# Patient Record
Sex: Male | Born: 1950 | Race: White | Hispanic: No | Marital: Married | Smoking: Never smoker
Health system: Southern US, Community
[De-identification: ages and names within clinical notes are randomized; demographics above are authoritative.]

## PROBLEM LIST (undated history)

## (undated) DIAGNOSIS — I2510 Atherosclerotic heart disease of native coronary artery without angina pectoris: Secondary | ICD-10-CM

## (undated) DIAGNOSIS — C801 Malignant (primary) neoplasm, unspecified: Secondary | ICD-10-CM

## (undated) DIAGNOSIS — E785 Hyperlipidemia, unspecified: Secondary | ICD-10-CM

## (undated) DIAGNOSIS — M1990 Unspecified osteoarthritis, unspecified site: Secondary | ICD-10-CM

## (undated) DIAGNOSIS — I10 Essential (primary) hypertension: Secondary | ICD-10-CM

## (undated) DIAGNOSIS — K219 Gastro-esophageal reflux disease without esophagitis: Secondary | ICD-10-CM

## (undated) HISTORY — DX: Unspecified osteoarthritis, unspecified site: M19.90

## (undated) HISTORY — DX: Gastro-esophageal reflux disease without esophagitis: K21.9

## (undated) HISTORY — DX: Hyperlipidemia, unspecified: E78.5

## (undated) HISTORY — DX: Atherosclerotic heart disease of native coronary artery without angina pectoris: I25.10

## (undated) HISTORY — PX: NOSE SURGERY: SHX723

## (undated) HISTORY — PX: JOINT REPLACEMENT: SHX530

## (undated) HISTORY — PX: KNEE ARTHROCENTESIS: SUR44

## (undated) HISTORY — DX: Essential (primary) hypertension: I10

## (undated) HISTORY — PX: SHOULDER ARTHROSCOPY: SHX128

## (undated) HISTORY — PX: CARDIAC CATHETERIZATION: SHX172

---

## 2002-11-11 ENCOUNTER — Encounter: Payer: Self-pay | Admitting: Cardiology

## 2002-11-11 ENCOUNTER — Inpatient Hospital Stay (HOSPITAL_COMMUNITY): Admission: AD | Admit: 2002-11-11 | Discharge: 2002-11-13 | Payer: Self-pay | Admitting: Cardiology

## 2002-11-24 ENCOUNTER — Encounter (HOSPITAL_COMMUNITY): Admission: RE | Admit: 2002-11-24 | Discharge: 2003-02-22 | Payer: Self-pay | Admitting: Cardiology

## 2003-02-23 ENCOUNTER — Encounter (HOSPITAL_COMMUNITY): Admission: RE | Admit: 2003-02-23 | Discharge: 2003-05-24 | Payer: Self-pay | Admitting: Cardiology

## 2010-10-09 ENCOUNTER — Encounter
Admission: RE | Admit: 2010-10-09 | Discharge: 2010-10-09 | Payer: Self-pay | Source: Home / Self Care | Attending: Specialist | Admitting: Specialist

## 2012-01-31 ENCOUNTER — Other Ambulatory Visit: Payer: Self-pay | Admitting: Orthopaedic Surgery

## 2012-01-31 DIAGNOSIS — M25512 Pain in left shoulder: Secondary | ICD-10-CM

## 2012-02-08 ENCOUNTER — Ambulatory Visit
Admission: RE | Admit: 2012-02-08 | Discharge: 2012-02-08 | Disposition: A | Discharge status: Home / Self Care | Payer: 59 | Source: Ambulatory Visit | Attending: Orthopaedic Surgery | Admitting: Orthopaedic Surgery

## 2012-02-08 DIAGNOSIS — M25512 Pain in left shoulder: Secondary | ICD-10-CM

## 2013-06-30 ENCOUNTER — Other Ambulatory Visit: Payer: Self-pay | Admitting: *Deleted

## 2013-06-30 DIAGNOSIS — Z79899 Other long term (current) drug therapy: Secondary | ICD-10-CM

## 2013-06-30 DIAGNOSIS — I2510 Atherosclerotic heart disease of native coronary artery without angina pectoris: Secondary | ICD-10-CM

## 2013-07-25 ENCOUNTER — Other Ambulatory Visit (INDEPENDENT_AMBULATORY_CARE_PROVIDER_SITE_OTHER): Payer: 59

## 2013-07-25 ENCOUNTER — Other Ambulatory Visit: Payer: Self-pay | Admitting: Cardiology

## 2013-07-25 DIAGNOSIS — Z79899 Other long term (current) drug therapy: Secondary | ICD-10-CM

## 2013-07-25 DIAGNOSIS — I2510 Atherosclerotic heart disease of native coronary artery without angina pectoris: Secondary | ICD-10-CM

## 2013-07-25 LAB — ALT: ALT: 35 U/L (ref 0–53)

## 2013-07-25 NOTE — Progress Notes (Signed)
Waiting on NMR panel

## 2013-07-28 LAB — NMR LIPOPROFILE WITH LIPIDS
Cholesterol, Total: 124 mg/dL (ref <200)
HDL Size: 8.4 nm — ABNORMAL LOW (ref >=9.2)
HDL-C: 41 mg/dL (ref >=40)
LDL (calc): 52 mg/dL (ref <100)
LDL Particle Number: 1055 nmol/L — ABNORMAL HIGH (ref <1000)
LDL Size: 20.1 nm — ABNORMAL LOW (ref >20.5)
Large HDL-P: 1.4 umol/L — ABNORMAL LOW (ref >=4.8)
Large VLDL-P: 9.3 nmol/L — ABNORMAL HIGH (ref <=2.7)
Triglycerides: 157 mg/dL — ABNORMAL HIGH (ref <150)
VLDL Size: 56.5 nm — ABNORMAL HIGH (ref <=46.6)

## 2013-07-31 ENCOUNTER — Other Ambulatory Visit: Payer: Self-pay | Admitting: General Surgery

## 2013-07-31 ENCOUNTER — Encounter: Payer: Self-pay | Admitting: General Surgery

## 2013-07-31 DIAGNOSIS — Z79899 Other long term (current) drug therapy: Secondary | ICD-10-CM

## 2013-07-31 DIAGNOSIS — I2510 Atherosclerotic heart disease of native coronary artery without angina pectoris: Secondary | ICD-10-CM

## 2013-11-13 ENCOUNTER — Other Ambulatory Visit: Payer: Self-pay | Admitting: Cardiology

## 2014-01-28 ENCOUNTER — Other Ambulatory Visit: Payer: 59

## 2014-02-23 ENCOUNTER — Other Ambulatory Visit: Payer: Self-pay | Admitting: Cardiology

## 2014-04-27 ENCOUNTER — Other Ambulatory Visit: Payer: Self-pay | Admitting: Cardiology

## 2014-04-27 NOTE — Telephone Encounter (Signed)
LVM for pt to return call. He has to set up F/U appt with TT before any more refills pt was due in July 2015.

## 2014-04-29 ENCOUNTER — Telehealth: Payer: Self-pay | Admitting: *Deleted

## 2014-04-29 NOTE — Telephone Encounter (Signed)
Ok to refill vytorin until patients appointment? Please advise. Thanks, MI

## 2014-04-29 NOTE — Telephone Encounter (Signed)
Follow up:    Pt called for refill and made a appt.

## 2014-04-29 NOTE — Telephone Encounter (Signed)
Already sent in for pt.

## 2014-06-26 ENCOUNTER — Other Ambulatory Visit: Payer: Self-pay | Admitting: Cardiology

## 2014-06-26 NOTE — Telephone Encounter (Signed)
I have seen this patient before

## 2014-07-13 ENCOUNTER — Ambulatory Visit: Payer: 59 | Admitting: Cardiology

## 2014-07-25 ENCOUNTER — Other Ambulatory Visit: Payer: Self-pay | Admitting: Cardiology

## 2014-07-27 NOTE — Telephone Encounter (Signed)
Please advise on refill. Patient has no-showed and cancelled the appointments that he had scheduled. He still has not been seen in this office. Thanks, MI

## 2014-08-03 ENCOUNTER — Other Ambulatory Visit: Payer: Self-pay

## 2014-08-03 ENCOUNTER — Ambulatory Visit (INDEPENDENT_AMBULATORY_CARE_PROVIDER_SITE_OTHER): Payer: 59 | Admitting: Cardiology

## 2014-08-03 ENCOUNTER — Other Ambulatory Visit: Payer: Self-pay | Admitting: Cardiology

## 2014-08-03 ENCOUNTER — Encounter: Payer: Self-pay | Admitting: Cardiology

## 2014-08-03 VITALS — BP 130/80 | HR 56 | Ht 69.0 in | Wt 207.0 lb

## 2014-08-03 DIAGNOSIS — E785 Hyperlipidemia, unspecified: Secondary | ICD-10-CM

## 2014-08-03 DIAGNOSIS — I2510 Atherosclerotic heart disease of native coronary artery without angina pectoris: Secondary | ICD-10-CM

## 2014-08-03 DIAGNOSIS — I2583 Coronary atherosclerosis due to lipid rich plaque: Principal | ICD-10-CM

## 2014-08-03 MED ORDER — NITROGLYCERIN 0.4 MG SL SUBL: 0.4000 mg | SUBLINGUAL_TABLET | SUBLINGUAL | Status: DC | PRN

## 2014-08-03 MED ORDER — ASPIRIN EC 81 MG PO TBEC: 81.0000 mg | DELAYED_RELEASE_TABLET | Freq: Every day | ORAL | Status: DC

## 2014-08-03 MED ORDER — EZETIMIBE-SIMVASTATIN 10-40 MG PO TABS: 1.0000 | ORAL_TABLET | Freq: Every day | ORAL | Status: DC

## 2014-08-03 MED ORDER — RAMIPRIL 5 MG PO CAPS: ORAL_CAPSULE | ORAL | Status: DC

## 2014-08-03 NOTE — Patient Instructions (Signed)
Your physician recommends that you have FASTING lab work.   Your physician has recommended you make the following change in your medication:  1) DECREASE your Aspirin to 81 mg daily   Your physician wants you to follow-up in: 1 year with Dr. Radford Pax. You will receive a reminder letter in the mail two months in advance. If you don't receive a letter, please call our office to schedule the follow-up appointment.

## 2014-08-03 NOTE — Progress Notes (Signed)
  [place], [place], [place]  [address] Phone: [phone] Fax:  [phone]  Date:  08/03/2014   ID:  Marcus Greer, DOB [birth year], MRN [id number]  PCP:  No primary care provider on file.  Cardiologist:  Fransico Him, MD    History of Present Illness: This is a 63yo WM with a history of ASCAD S/P PCI of the prox RCA with residual 40-50% distal RCA, dyslipidemia and GERD.  He is doing well today.  He denies any chest pain, SOB, DOE, LE edema, dizziness, palpitations or syncope.  He goes to the gym 3 days weekly and is walking 10,000 steps daily.   Wt Readings from Last 3 Encounters:  08/03/14 207 lb (93.895 kg)     Past Medical History  Diagnosis Date  . ASCVD (arteriosclerotic cardiovascular disease)   . GERD (gastroesophageal reflux disease)   . DJD (degenerative joint disease)   . Hyperlipidemia   . Coronary artery disease     s/p PCI of the prox RCA with residual 40-50% distal RCA 11/2002    Current Outpatient Prescriptions  Medication Sig Dispense Refill  . aspirin 325 MG tablet Take 325 mg by mouth daily.    Marland Kitchen ezetimibe-simvastatin (VYTORIN) 10-40 MG per tablet Take 1 tablet by mouth daily at 6 PM. 30 tablet 3  . Misc Natural Products (OSTEO BI-FLEX ADV DOUBLE ST) CAPS Take by mouth 2 (two) times daily.    . nitroGLYCERIN (NITROSTAT) 0.4 MG SL tablet Place 0.4 mg under the tongue every 5 (five) minutes as needed for chest pain.    . Omega-3 Fatty Acids (FISH OIL) 1200 MG CAPS Take by mouth 2 (two) times daily.    . ramipril (ALTACE) 5 MG capsule TAKE 1 CAPSULE BY MOUTH EVERY DAY 30 capsule 3  . vitamin C (ASCORBIC ACID) 500 MG tablet Take 500 mg by mouth daily.     No current facility-administered medications for this visit.    Allergies:    Allergies  Allergen Reactions  . Gemfibrozil   . Niaspan [Niacin Er]   . Tape     Social History:  The patient  reports that he has never smoked. He does not have any smokeless tobacco history on file. He  reports that he drinks alcohol.   Family History:  The patient's family history includes Diabetes in his father; Epilepsy in his sister.   ROS:  Please see the history of present illness.      All other systems reviewed and negative.   PHYSICAL EXAM: VS:  BP 130/80 mmHg  Pulse 56  Ht 5\' 9"  (1.753 m)  Wt 207 lb (93.895 kg)  BMI 30.55 kg/m2 Well nourished, well developed, in no acute distress HEENT: normal Neck: no JVD Cardiac:  normal S1, S2; RRR; no murmur Lungs:  clear to auscultation bilaterally, no wheezing, rhonchi or rales Abd: soft, nontender, no hepatomegaly Ext: no edema Skin: warm and dry Neuro:  CNs 2-12 intact, no focal abnormalities noted  EKG:  Sinus bradycardia at 56bpm with no ST changes     ASSESSMENT AND PLAN:  1. ASCAD s/p remote PCI of prox RCA with residual nonobstructive disease distally with no angina - decrease ASA to 81mg  daily 2. Dyslipidemia - continue Vytorin - check FLP and ALT  Followup with me in 1 year  Signed, Fransico Him, MD [facility] 08/03/2014 4:08 PM

## 2014-08-04 NOTE — Telephone Encounter (Addendum)
This medication was refilled on earlier today.

## 2014-08-05 ENCOUNTER — Other Ambulatory Visit: Payer: 59

## 2014-08-10 ENCOUNTER — Encounter: Payer: Self-pay | Admitting: Cardiology

## 2014-08-12 ENCOUNTER — Other Ambulatory Visit (INDEPENDENT_AMBULATORY_CARE_PROVIDER_SITE_OTHER): Payer: 59 | Admitting: *Deleted

## 2014-08-12 DIAGNOSIS — E785 Hyperlipidemia, unspecified: Secondary | ICD-10-CM

## 2014-08-12 DIAGNOSIS — I2510 Atherosclerotic heart disease of native coronary artery without angina pectoris: Secondary | ICD-10-CM

## 2014-08-12 DIAGNOSIS — I2583 Coronary atherosclerosis due to lipid rich plaque: Principal | ICD-10-CM

## 2014-08-12 LAB — LIPID PANEL
Cholesterol: 114 mg/dL (ref 0–200)
HDL: 43 mg/dL (ref >39)
LDL CALC: 53 mg/dL (ref 0–99)
Total CHOL/HDL Ratio: 2.7 Ratio
Triglycerides: 91 mg/dL (ref <150)
VLDL: 18 mg/dL (ref 0–40)

## 2014-08-12 LAB — ALT: ALT: 28 U/L (ref 0–53)

## 2014-08-29 ENCOUNTER — Other Ambulatory Visit: Payer: Self-pay | Admitting: Cardiology

## 2014-12-28 ENCOUNTER — Other Ambulatory Visit: Payer: Self-pay | Admitting: Cardiology

## 2015-01-08 ENCOUNTER — Ambulatory Visit (INDEPENDENT_AMBULATORY_CARE_PROVIDER_SITE_OTHER): Payer: 59

## 2015-01-08 ENCOUNTER — Ambulatory Visit (INDEPENDENT_AMBULATORY_CARE_PROVIDER_SITE_OTHER): Payer: 59 | Admitting: Podiatry

## 2015-01-08 ENCOUNTER — Encounter: Payer: Self-pay | Admitting: Podiatry

## 2015-01-08 VITALS — BP 153/83 | HR 57 | Resp 15

## 2015-01-08 DIAGNOSIS — M722 Plantar fascial fibromatosis: Secondary | ICD-10-CM

## 2015-01-08 MED ORDER — TRIAMCINOLONE ACETONIDE 10 MG/ML IJ SUSP
10.0000 mg | Freq: Once | INTRAMUSCULAR | Status: AC
  Administered 2015-01-08: 10 mg

## 2015-01-08 MED ORDER — DICLOFENAC SODIUM 75 MG PO TBEC: 75.0000 mg | DELAYED_RELEASE_TABLET | Freq: Two times a day (BID) | ORAL | Status: DC

## 2015-01-08 NOTE — Progress Notes (Signed)
Subjective:     Patient ID: Marcus Greer, male   DOB: November 24, 1950, 64 y.o.   MRN: [id number]  HPI patient presents with severe pain in the plantar of the right plantar fascia stating it's been hurting him for about 4 months. States she's tried treatment options without relief and affecting what he does   Review of Systems  All other systems reviewed and are negative.      Objective:   Physical Exam  Constitutional: He is oriented to person, place, and time.  Cardiovascular: Intact distal pulses.   Musculoskeletal: Normal range of motion.  Neurological: He is oriented to person, place, and time.  Skin: Skin is warm.  Nursing note and vitals reviewed.  her vascular status intact with muscle strength adequate and range of motion subtalar midtarsal joint within normal limits. Patient's noted to have good digital perfusion is well oriented 3 and has significant discomfort plantar aspect right heel at the insertional point of the tendon into the calcaneus with fluid buildup. Patient is noted to have no equinus condition     Assessment:     Plantar fasciitis right with inflammation and fluid of the medial band    Plan:     H&P and x-rays reviewed and today I injected the right plantar fascia 3 mg Kenalog 5 mill grams Xylocaine and applied fascial brace with instructions on usage. Gave instructions on physical therapy and prescribed diclofenac 75 mg twice a day

## 2015-01-08 NOTE — Patient Instructions (Signed)

## 2015-01-08 NOTE — Progress Notes (Signed)
   Subjective:    Patient ID: Marcus Greer, male    DOB: 15-Jul-1951, 64 y.o.   MRN: [id number]  HPI Pt presents with right foot plantar fascial pain, has tried icing and stretching and otc NSAIDS   Review of Systems  Musculoskeletal: Positive for myalgias.  Neurological: Positive for weakness.  All other systems reviewed and are negative.      Objective:   Physical Exam        Assessment & Plan:

## 2015-01-22 ENCOUNTER — Telehealth: Payer: Self-pay

## 2015-01-22 ENCOUNTER — Telehealth: Payer: Self-pay | Admitting: *Deleted

## 2015-01-22 NOTE — Telephone Encounter (Signed)
Pt complains of cramping in his legs and thighs at night and a feeling of possible cramping when resting.  Pt asked if he could go off the antiinflammatory medication since his feet feel better and they aren't cramping.

## 2015-01-22 NOTE — Telephone Encounter (Signed)
Pt called stating he was having unusual cramping in his thighs and feet since starting the diclofenac. Denied SHOb or chest pain. States that he overall felt well but the cramps concerned him. I advised him to discontinue the diclofenac and monitor the cramps for the next day or 2. If the cramping worsens or he experiences symptoms of shob or chest pain he is to seek medical attention immediately

## 2015-01-25 ENCOUNTER — Ambulatory Visit (INDEPENDENT_AMBULATORY_CARE_PROVIDER_SITE_OTHER): Payer: 59 | Admitting: Podiatry

## 2015-01-25 ENCOUNTER — Encounter: Payer: Self-pay | Admitting: Podiatry

## 2015-01-25 VITALS — BP 169/94 | HR 71 | Resp 15

## 2015-01-25 DIAGNOSIS — M722 Plantar fascial fibromatosis: Secondary | ICD-10-CM

## 2015-01-26 NOTE — Progress Notes (Signed)
Subjective:     Patient ID: Marcus Greer, male   DOB: 07-17-1951, 64 y.o.   MRN: [id number]  HPI patient states that my heel is doing pretty well but I'm still having some discomfort and I been getting a lot of cramping in my legs and my calf that when I stop taking the medicine seems somewhat better but still present   Review of Systems     Objective:   Physical Exam Neurovascular status intact muscle strength adequate with diminished discomfort right plantar heel upon insertion of the tendon into the calcaneus. Patient is still having some discomfort but it is somewhat better and there is depression of the arch noted    Assessment:     Plantar fascial symptomatology right still present but improved with cramps which may be due to change in gait or possible medicine that he had been taken    Plan:     At this point I scanned for custom orthotic devices and we'll start him on several ounces a tonic water at night and Baths before bed to try to increase circulation. If cramps persist we may need to look at blood work to see if anything else is going on and he will be seen back for orthotics to be dispensed in approximately 3 weeks

## 2015-02-19 ENCOUNTER — Ambulatory Visit: Payer: 59 | Admitting: *Deleted

## 2015-02-19 DIAGNOSIS — M722 Plantar fascial fibromatosis: Secondary | ICD-10-CM

## 2015-02-19 NOTE — Progress Notes (Signed)
Patient ID: Marcus Greer, male   DOB: November 21, 1950, 63 y.o.   MRN: [id number] PICKING UP INSERTS

## 2015-02-19 NOTE — Patient Instructions (Signed)

## 2015-03-09 ENCOUNTER — Telehealth: Payer: Self-pay | Admitting: Cardiology

## 2015-03-09 DIAGNOSIS — E785 Hyperlipidemia, unspecified: Secondary | ICD-10-CM

## 2015-03-09 NOTE — Telephone Encounter (Signed)
Left message to call back  

## 2015-03-09 NOTE — Telephone Encounter (Signed)
New Message       Pt calling stating that he would like to discuss some medication changes. Please call back and advise.

## 2015-03-10 NOTE — Telephone Encounter (Signed)
Patient st he has had generalized cramping since starting a statin. Lately, the cramping seems to be more constant. The patient is also concerned because he switched to a high deductible plan and cannot afford Vytorin anyway. He requests to try atorvastatin 20 mg to see if he can tolerate a lower dose.  To Dr. Radford Pax for recommendations.

## 2015-03-10 NOTE — Telephone Encounter (Signed)
OK to change Lipitor 20mg  daily and check FLP and ALT in 6 weeks

## 2015-03-10 NOTE — Telephone Encounter (Signed)
Follow UP  Pt returning Katy's phone call

## 2015-03-11 MED ORDER — ATORVASTATIN CALCIUM 20 MG PO TABS: 20.0000 mg | ORAL_TABLET | Freq: Every day | ORAL | Status: DC

## 2015-03-11 NOTE — Telephone Encounter (Signed)
Instructed patient to STOP Vytorin and START LIPITOR 20 mg daily. FLP and ALT scheduled August 12. Patient agrees with treatment plan.

## 2015-04-23 ENCOUNTER — Other Ambulatory Visit: Payer: Self-pay

## 2015-04-23 ENCOUNTER — Encounter: Payer: Self-pay | Admitting: Cardiology

## 2015-05-14 ENCOUNTER — Other Ambulatory Visit (INDEPENDENT_AMBULATORY_CARE_PROVIDER_SITE_OTHER): Payer: 59 | Admitting: *Deleted

## 2015-05-14 DIAGNOSIS — E785 Hyperlipidemia, unspecified: Secondary | ICD-10-CM | POA: Diagnosis not present

## 2015-05-14 LAB — LIPID PANEL
CHOL/HDL RATIO: 3
Cholesterol: 126 mg/dL (ref 0–200)
HDL: 36.3 mg/dL — ABNORMAL LOW (ref >39.00)
LDL Cholesterol: 74 mg/dL (ref 0–99)
NonHDL: 89.33
Triglycerides: 79 mg/dL (ref 0.0–149.0)
VLDL: 15.8 mg/dL (ref 0.0–40.0)

## 2015-05-14 LAB — ALT: ALT: 23 U/L (ref 0–53)

## 2015-05-21 ENCOUNTER — Telehealth: Payer: Self-pay

## 2015-05-21 DIAGNOSIS — E785 Hyperlipidemia, unspecified: Secondary | ICD-10-CM

## 2015-05-21 NOTE — Telephone Encounter (Signed)
-----   Message from Sueanne Margarita, MD sent at 05/14/2015  7:17 PM EDT ----- Increase lipitor to 40mg  daily and recheck FLP and ALT in 6 weeks

## 2015-05-21 NOTE — Telephone Encounter (Signed)
Patient st he has been losing weight and eating much better. He refuses to increase medications at this time. Repeat lab work scheduled for 12/5 for Dr. Radford Pax to review at [facility] 12/7. Patient agrees with treatment plan.

## 2015-08-10 ENCOUNTER — Other Ambulatory Visit: Payer: Self-pay | Admitting: Cardiology

## 2015-08-16 ENCOUNTER — Other Ambulatory Visit (INDEPENDENT_AMBULATORY_CARE_PROVIDER_SITE_OTHER): Payer: 59 | Admitting: *Deleted

## 2015-08-16 DIAGNOSIS — E785 Hyperlipidemia, unspecified: Secondary | ICD-10-CM

## 2015-08-16 LAB — ALT: ALT: 31 U/L (ref 9–46)

## 2015-08-16 LAB — LIPID PANEL
CHOLESTEROL: 134 mg/dL (ref 125–200)
HDL: 37 mg/dL — ABNORMAL LOW (ref >=40)
LDL Cholesterol: 77 mg/dL (ref <130)
TRIGLYCERIDES: 102 mg/dL (ref <150)
Total CHOL/HDL Ratio: 3.6 Ratio (ref <=5.0)
VLDL: 20 mg/dL (ref <30)

## 2015-08-18 ENCOUNTER — Encounter: Payer: Self-pay | Admitting: Cardiology

## 2015-08-18 ENCOUNTER — Ambulatory Visit (INDEPENDENT_AMBULATORY_CARE_PROVIDER_SITE_OTHER): Payer: 59 | Admitting: Cardiology

## 2015-08-18 VITALS — BP 130/78 | HR 56 | Ht 69.5 in | Wt 205.0 lb

## 2015-08-18 DIAGNOSIS — I2583 Coronary atherosclerosis due to lipid rich plaque: Principal | ICD-10-CM

## 2015-08-18 DIAGNOSIS — E785 Hyperlipidemia, unspecified: Secondary | ICD-10-CM | POA: Diagnosis not present

## 2015-08-18 DIAGNOSIS — I2510 Atherosclerotic heart disease of native coronary artery without angina pectoris: Secondary | ICD-10-CM | POA: Diagnosis not present

## 2015-08-18 NOTE — Progress Notes (Signed)
Cardiology Office Note   Date:  08/18/2015   ID:  Marcus Greer, DOB 1950/09/27, MRN [id number]  PCP:   Melinda Crutch, MD    Chief Complaint  Patient presents with  . Coronary Artery Disease      History of Present Illness: This is a 64yo WM with a history of ASCAD S/P PCI of the prox RCA with residual 40-50% distal RCA, dyslipidemia and GERD. He is doing well today. He denies any chest pain,  LE edema, dizziness, palpitations or syncope. He has no DOE except for extreme exertion.  He goes to the gym 3 days weekly and is walking at home.     Past Medical History  Diagnosis Date  . ASCVD (arteriosclerotic cardiovascular disease)   . GERD (gastroesophageal reflux disease)   . DJD (degenerative joint disease)   . Hyperlipidemia   . Coronary artery disease     s/p PCI of the prox RCA with residual 40-50% distal RCA 11/2002    Past Surgical History  Procedure Laterality Date  . Knee arthrocentesis    . Cardiac catheterization    . Nose surgery    . Shoulder arthroscopy       Current Outpatient Prescriptions  Medication Sig Dispense Refill  . aspirin EC 81 MG tablet Take 1 tablet (81 mg total) by mouth daily. 90 tablet 3  . atorvastatin (LIPITOR) 20 MG tablet Take 1 tablet (20 mg total) by mouth daily. 90 tablet 3  . Misc Natural Products (OSTEO BI-FLEX ADV DOUBLE ST) CAPS Take by mouth 2 (two) times daily.    . nitroGLYCERIN (NITROSTAT) 0.4 MG SL tablet Place 1 tablet (0.4 mg total) under the tongue every 5 (five) minutes as needed for chest pain. 25 tablet 1  . Omega-3 Fatty Acids (FISH OIL) 1200 MG CAPS Take by mouth 2 (two) times daily.    . ramipril (ALTACE) 5 MG capsule TAKE 1 CAPSULE BY MOUTH EVERY DAY 90 capsule 3   No current facility-administered medications for this visit.    Allergies:   Gemfibrozil; Niaspan; and Tape    Social History:  The patient  reports that he has never smoked. He does not have any smokeless tobacco history on  file. He reports that he drinks alcohol.   Family History:  The patient's family history includes Diabetes in his father; Epilepsy in his sister.    ROS:  Please see the history of present illness.   Otherwise, review of systems are positive for none.   All other systems are reviewed and negative.    PHYSICAL EXAM: VS:  BP 130/78 mmHg  Pulse 56  Ht 5' 9.5" (1.765 m)  Wt 205 lb (92.987 kg)  BMI 29.85 kg/m2 , BMI Body mass index is 29.85 kg/(m^2). GEN: Well nourished, well developed, in no acute distress HEENT: normal Neck: no JVD, carotid bruits, or masses Cardiac: RRR; no murmurs, rubs, or gallops,no edema  Respiratory:  clear to auscultation bilaterally, normal work of breathing GI: soft, nontender, nondistended, + BS MS: no deformity or atrophy Skin: warm and dry, no rash Neuro:  Strength and sensation are intact Psych: euthymic mood, full affect   EKG:  EKG is ordered today. The ekg ordered today demonstrates sinus bradycardia at 56bpm with no ST changes   Recent Labs: 08/16/2015: ALT 31    Lipid Panel    Component Value Date/Time   CHOL 134 08/16/2015  0754   CHOL 124 07/25/2013 0750   TRIG 102 08/16/2015 0754   TRIG 157* 07/25/2013 0750   HDL 37* 08/16/2015 0754   HDL 41 07/25/2013 0750   CHOLHDL 3.6 08/16/2015 0754   VLDL 20 08/16/2015 0754   LDLCALC 77 08/16/2015 0754   LDLCALC 52 07/25/2013 0750      Wt Readings from Last 3 Encounters:  08/18/15 205 lb (92.987 kg)  08/03/14 207 lb (93.895 kg)     ASSESSMENT AND PLAN:  1. ASCAD s/p remote PCI of prox RCA with residual nonobstructive disease distally with no angina - continue ASA 2. Dyslipidemia - LDL slightly elevated but dose not want to go up on statin due to muscle aches and memory issues.  He will work harder on diet and exercise. Recheck lipids in 6 months -Continue statin.     Current medicines are reviewed at length with the patient today.  The patient does not have concerns regarding  medicines.  The following changes have been made:  no change  Labs/ tests ordered today: See above Assessment and Plan No orders of the defined types were placed in this encounter.     Disposition:   FU with me in 1 year  Signed, Sueanne Margarita, MD  08/18/2015 1:41 PM    [facility] [place], [place], [place]  [address] Phone: [phone]; Fax: [phone]

## 2015-08-18 NOTE — Patient Instructions (Signed)
Medication Instructions:  Your physician recommends that you continue on your current medications as directed. Please refer to the Current Medication list given to you today.   Labwork: Your physician recommends that you return for FASTING lab work in 6 MONTHS.  Testing/Procedures: None  Follow-Up: Your physician wants you to follow-up in: 1 year with Dr. Turner. You will receive a reminder letter in the mail two months in advance. If you don't receive a letter, please call our office to schedule the follow-up appointment.   Any Other Special Instructions Will Be Listed Below (If Applicable).     If you need a refill on your cardiac medications before your next appointment, please call your pharmacy.   

## 2015-11-09 ENCOUNTER — Other Ambulatory Visit: Payer: Self-pay | Admitting: Cardiology

## 2016-02-03 ENCOUNTER — Encounter: Payer: Self-pay | Admitting: Cardiology

## 2016-02-04 ENCOUNTER — Telehealth: Payer: Self-pay

## 2016-02-04 MED ORDER — EZETIMIBE 10 MG PO TABS: 10.0000 mg | ORAL_TABLET | Freq: Every day | ORAL | Status: DC

## 2016-02-04 NOTE — Telephone Encounter (Signed)
See result note.  

## 2016-02-04 NOTE — Telephone Encounter (Signed)
-----   Message from Sueanne Margarita, MD sent at 02/03/2016  8:05 PM EDT ----- LDL still not at goal - 88 and goal is < 70 despite trying to be better with his diet.  We have 2 options:  Either increase lipitor to 40mg  daily or add zetia 10mg  daily - please find out which the patient wishes to proceed with

## 2016-02-06 ENCOUNTER — Other Ambulatory Visit: Payer: Self-pay | Admitting: Cardiology

## 2016-02-14 ENCOUNTER — Other Ambulatory Visit: Payer: Self-pay | Admitting: Cardiology

## 2016-03-03 ENCOUNTER — Telehealth: Payer: Self-pay | Admitting: Cardiology

## 2016-03-03 NOTE — Telephone Encounter (Signed)
New message      Pt states that the doctor want him to start taking zetia in addition to atorvastatin and ramipril.  He states that the zetia is 200.00 per month.  He does not want to add this medication.  He states he feels fine and is doing fine.  Ok to call next week to discuss

## 2016-03-03 NOTE — Telephone Encounter (Signed)
Will have Dr Radford Pax review for further recommendations/orders and call back next week.

## 2016-03-04 NOTE — Telephone Encounter (Signed)
Please find out what LDL level was

## 2016-03-06 ENCOUNTER — Telehealth: Payer: Self-pay | Admitting: *Deleted

## 2016-03-06 ENCOUNTER — Other Ambulatory Visit: Payer: Self-pay | Admitting: *Deleted

## 2016-03-06 MED ORDER — RAMIPRIL 5 MG PO CAPS: 5.0000 mg | ORAL_CAPSULE | Freq: Every day | ORAL | Status: DC

## 2016-03-06 NOTE — Telephone Encounter (Signed)
please call pt back regarding atorvastatin & zetia, see phone note 03/03/16

## 2016-03-06 NOTE — Telephone Encounter (Signed)
Labs done at [facility] office in April were sent to [place] in May.  Per your note: LDL still not at goal - 88 and goal is < 70 despite trying to be better with his diet. We have 2 options: Either increase lipitor to 40mg  daily or add zetia 10mg  daily - please find out which the patient wishes to proceed with.

## 2016-03-06 NOTE — Telephone Encounter (Signed)
ramipril (ALTACE) 5 MG capsule  Medication   Date: 11/10/2015  Department: [facility] Office  Ordering/Authorizing: Sueanne Margarita, MD      Order Providers    Prescribing Provider Encounter Provider   Sueanne Margarita, MD Sueanne Margarita, MD    Medication Detail      Disp Refills Start End     ramipril (ALTACE) 5 MG capsule 90 capsule 2 11/10/2015     Sig: TAKE ONE CAPSULE BY MOUTH EVERY DAY    E-Prescribing Status: Receipt confirmed by pharmacy (11/10/2015 11:44 AM EST)     Pharmacy    [facility] [id number] - [place], [place] - [address] AT [place] told pt they did not have refills of this on file, will resend.

## 2016-03-07 NOTE — Telephone Encounter (Signed)
Needs to start Zetia and repeat FLP and ALT in 8 weeks

## 2016-03-07 NOTE — Telephone Encounter (Signed)
Roberts Gaudy, CMA at 03/06/2016 1:38 PM     Status: Signed       Expand All Collapse All   please call pt back regarding atorvastatin & zetia, see phone note 03/03/16

## 2016-03-08 NOTE — Telephone Encounter (Signed)
Patient is unwilling to pay for Zetia (it would be $200 monthly).  He is unwilling to increase Lipitor dose at this time - he has experienced cramping and muscle aches on higher doses and tolerates the 20 mg well. He states that he has started an exercise program with a fitness coach and will be more strict with his diet.  He will try lifestyle changes for now instead of medication changes. Patient was grateful for call.

## 2016-04-02 ENCOUNTER — Other Ambulatory Visit: Payer: Self-pay | Admitting: Cardiology

## 2016-08-14 ENCOUNTER — Telehealth: Payer: Self-pay | Admitting: Cardiology

## 2016-08-14 MED ORDER — RAMIPRIL 5 MG PO CAPS: 5.0000 mg | ORAL_CAPSULE | Freq: Every day | ORAL | 0 refills | Status: DC

## 2016-08-14 MED ORDER — ATORVASTATIN CALCIUM 20 MG PO TABS: 20.0000 mg | ORAL_TABLET | Freq: Every day | ORAL | 0 refills | Status: DC

## 2016-08-14 NOTE — Telephone Encounter (Signed)
Pt Rx was sent to pt's pharmacy as requested. Confirmation received. °

## 2016-08-14 NOTE — Telephone Encounter (Signed)
New Message   *STAT* If patient is at the pharmacy, call can be transferred to refill team.   1. Which medications need to be refilled? (please list name of each medication and dose if known)  atorvastatin 20 mg tablet once daily ramipril 5 mg capsule 5 mg total once daily  2. Which pharmacy/location (including street and city if local pharmacy) is medication to be sent to? [facility] [address], [place], [place], [place] [address]  3. Do they need a 30 day or 90 day supply? 90 day supply

## 2016-10-04 ENCOUNTER — Ambulatory Visit (INDEPENDENT_AMBULATORY_CARE_PROVIDER_SITE_OTHER): Payer: 59 | Admitting: Cardiology

## 2016-10-04 ENCOUNTER — Encounter: Payer: Self-pay | Admitting: Cardiology

## 2016-10-04 VITALS — BP 126/90 | HR 75 | Ht 69.0 in | Wt 212.0 lb

## 2016-10-04 DIAGNOSIS — R0609 Other forms of dyspnea: Secondary | ICD-10-CM | POA: Diagnosis not present

## 2016-10-04 DIAGNOSIS — E7800 Pure hypercholesterolemia, unspecified: Secondary | ICD-10-CM

## 2016-10-04 DIAGNOSIS — I2510 Atherosclerotic heart disease of native coronary artery without angina pectoris: Secondary | ICD-10-CM

## 2016-10-04 NOTE — Patient Instructions (Signed)
Medication Instructions:  Your physician recommends that you continue on your current medications as directed. Please refer to the Current Medication list given to you today.   Labwork: Your physician recommends that you return for FASTING lab work on the same day as your stress test.   Testing/Procedures: Your physician has requested that you have an exercise tolerance test. For further information please visit [email or web address]. Please also follow instruction sheet, as given.  Follow-Up: Your physician wants you to follow-up in: 1 year with Dr. Radford Pax. You will receive a reminder letter in the mail two months in advance. If you don't receive a letter, please call our office to schedule the follow-up appointment.   Any Other Special Instructions Will Be Listed Below (If Applicable).     If you need a refill on your cardiac medications before your next appointment, please call your pharmacy.

## 2016-10-04 NOTE — Progress Notes (Signed)
Cardiology Office Note    Date:  10/04/2016   ID:  Marcus Greer, DOB 1951/07/31, MRN [id number]  PCP:  Melinda Crutch, MD  Cardiologist:  Fransico Him, MD   Chief Complaint  Patient presents with  . Coronary Artery Disease  . Hyperlipidemia    History of Present Illness:  Marcus Greer is a 66 y.o. male with a history of ASCAD S/P PCI of the prox RCA with residual 40-50% distal RCA, dyslipidemia and GERD. He is doing well today. He denies any chest pain or pressure, LE edema, dizziness, palpitations or syncope. He has gained 7lbs over the past year due to stress eating from job stress.  He has some DOE which he thinks is worse but he has gained 7lbs.   He goes to the gym 3 days weekly.    Past Medical History:  Diagnosis Date  . ASCVD (arteriosclerotic cardiovascular disease)   . Coronary artery disease    s/p PCI of the prox RCA with residual 40-50% distal RCA 11/2002  . DJD (degenerative joint disease)   . GERD (gastroesophageal reflux disease)   . Hyperlipidemia     Past Surgical History:  Procedure Laterality Date  . CARDIAC CATHETERIZATION    . KNEE ARTHROCENTESIS    . NOSE SURGERY    . SHOULDER ARTHROSCOPY      Current Medications: Outpatient Medications Prior to Visit  Medication Sig Dispense Refill  . aspirin EC 81 MG tablet Take 1 tablet (81 mg total) by mouth daily. 90 tablet 3  . atorvastatin (LIPITOR) 20 MG tablet Take 1 tablet (20 mg total) by mouth daily. 90 tablet 0  . Misc Natural Products (OSTEO BI-FLEX ADV DOUBLE ST) CAPS Take by mouth 2 (two) times daily.    . nitroGLYCERIN (NITROSTAT) 0.4 MG SL tablet Place 1 tablet (0.4 mg total) under the tongue every 5 (five) minutes as needed for chest pain. 25 tablet 1  . ramipril (ALTACE) 5 MG capsule Take 1 capsule (5 mg total) by mouth daily. 90 capsule 0  . Omega-3 Fatty Acids (FISH OIL) 1200 MG CAPS Take by mouth 2 (two) times daily.    Marland Kitchen ezetimibe (ZETIA) 10 MG tablet Take 1 tablet (10 mg total) by  mouth daily. 90 tablet 3   No facility-administered medications prior to visit.      Allergies:   Gemfibrozil; Niaspan [niacin er]; and Tape   Social History   Social History  . Marital status: Married    Spouse name: N/A  . Number of children: N/A  . Years of education: N/A   Social History Main Topics  . Smoking status: Never Smoker  . Smokeless tobacco: Never Used  . Alcohol use Yes     Comment: occsaional  . Drug use: Unknown  . Sexual activity: Not Asked   Other Topics Concern  . None   Social History Narrative  . None     Family History:  The patient's family history includes Diabetes in his father; Epilepsy in his sister.   ROS:   Please see the history of present illness.    ROS All other systems reviewed and are negative.  No flowsheet data found.     PHYSICAL EXAM:   VS:  BP 126/90   Pulse 75   Ht 5\' 9"  (1.753 m)   Wt 212 lb (96.2 kg)   SpO2 97%   BMI 31.31 kg/m    GEN: Well nourished, well developed, in no acute distress  HEENT:  normal  Neck: no JVD, carotid bruits, or masses Cardiac: RRR; no murmurs, rubs, or gallops,no edema.  Intact distal pulses bilaterally.  Respiratory:  clear to auscultation bilaterally, normal work of breathing GI: soft, nontender, nondistended, + BS MS: no deformity or atrophy  Skin: warm and dry, no rash Neuro:  Alert and Oriented x 3, Strength and sensation are intact Psych: euthymic mood, full affect  Wt Readings from Last 3 Encounters:  10/04/16 212 lb (96.2 kg)  08/18/15 205 lb (93 kg)  08/03/14 207 lb (93.9 kg)      Studies/Labs Reviewed:   EKG:  EKG is ordered today and showed NSR with IRBBB and no ST changes  Recent Labs: No results found for requested labs within last 8760 hours.   Lipid Panel    Component Value Date/Time   CHOL 134 08/16/2015 0754   CHOL 124 07/25/2013 0750   TRIG 102 08/16/2015 0754   TRIG 157 (H) 07/25/2013 0750   HDL 37 (L) 08/16/2015 0754   HDL 41 07/25/2013 0750    CHOLHDL 3.6 08/16/2015 0754   VLDL 20 08/16/2015 0754   LDLCALC 77 08/16/2015 0754   LDLCALC 52 07/25/2013 0750    Additional studies/ records that were reviewed today include:  none    ASSESSMENT:    1. Coronary artery disease involving native coronary artery of native heart without angina pectoris   2. Pure hypercholesterolemia   3. DOE (dyspnea on exertion)      PLAN:  In order of problems listed above:  1. ASCAD - S/P PCI of the prox RCA with residual 40-50% distal RCA - he has not had any anginal symptoms.  She will continue on ASA/statin and ACE I. 2. Hyperlipidemia - LDL goal < 70.  Continue statin.  Check FLP and ALT. 3. SOB - I suspect that this is due to weight gain over the past year but he has not had an ischemic workup since 2011 so I will get an ETT since his EKG is normal.      Medication Adjustments/Labs and Tests Ordered: Current medicines are reviewed at length with the patient today.  Concerns regarding medicines are outlined above.  Medication changes, Labs and Tests ordered today are listed in the Patient Instructions below.  There are no Patient Instructions on file for this visit.   Signed, Fransico Him, MD  10/04/2016 11:07 AM    [address], [place], [place]  [address] Phone: [phone]; Fax: [phone]

## 2016-10-17 ENCOUNTER — Other Ambulatory Visit: Payer: 59 | Admitting: *Deleted

## 2016-10-17 ENCOUNTER — Ambulatory Visit (INDEPENDENT_AMBULATORY_CARE_PROVIDER_SITE_OTHER): Payer: 59

## 2016-10-17 DIAGNOSIS — I2510 Atherosclerotic heart disease of native coronary artery without angina pectoris: Secondary | ICD-10-CM

## 2016-10-17 DIAGNOSIS — E7800 Pure hypercholesterolemia, unspecified: Secondary | ICD-10-CM

## 2016-10-17 DIAGNOSIS — R0609 Other forms of dyspnea: Secondary | ICD-10-CM | POA: Diagnosis not present

## 2016-10-17 LAB — EXERCISE TOLERANCE TEST
CHL CUP RESTING HR STRESS: 64 {beats}/min
CSEPEDS: 0 s
CSEPPHR: 146 {beats}/min
Estimated workload: 10.1 METS
Exercise duration (min): 9 min
MPHR: 157 {beats}/min
Percent HR: 92 %
RPE: 16

## 2016-10-17 LAB — HEPATIC FUNCTION PANEL
ALBUMIN: 4.6 g/dL (ref 3.6–4.8)
ALT: 35 IU/L (ref 0–44)
AST: 29 IU/L (ref 0–40)
Alkaline Phosphatase: 49 IU/L (ref 39–117)
BILIRUBIN TOTAL: 0.5 mg/dL (ref 0.0–1.2)
Bilirubin, Direct: 0.15 mg/dL (ref 0.00–0.40)
Total Protein: 6.9 g/dL (ref 6.0–8.5)

## 2016-10-17 LAB — LIPID PANEL
Chol/HDL Ratio: 3.9 ratio units (ref 0.0–5.0)
Cholesterol, Total: 148 mg/dL (ref 100–199)
HDL: 38 mg/dL — AB (ref >39)
LDL Calculated: 83 mg/dL (ref 0–99)
Triglycerides: 134 mg/dL (ref 0–149)
VLDL CHOLESTEROL CAL: 27 mg/dL (ref 5–40)

## 2016-10-18 ENCOUNTER — Telehealth: Payer: Self-pay

## 2016-10-18 DIAGNOSIS — E785 Hyperlipidemia, unspecified: Secondary | ICD-10-CM

## 2016-10-18 NOTE — Telephone Encounter (Signed)
-----   Message from Sueanne Margarita, MD sent at 10/18/2016  7:32 AM EST ----- Increase Lipitor to 40mg  daily and repeat FLP and ALT in 6 weeks

## 2016-10-18 NOTE — Telephone Encounter (Signed)
Informed patient of results and verbal understanding expressed.  Patient states he has been on vacation and has not eaten very healthily.  Instead of increasing medication, patient will diet and exercise and will have fasting labs rechecked in 4 months. Patient agrees with treatment plan.

## 2016-11-22 ENCOUNTER — Other Ambulatory Visit: Payer: Self-pay | Admitting: Cardiology

## 2017-01-17 DIAGNOSIS — M25562 Pain in left knee: Secondary | ICD-10-CM | POA: Diagnosis not present

## 2017-01-17 DIAGNOSIS — M25561 Pain in right knee: Secondary | ICD-10-CM | POA: Diagnosis not present

## 2017-01-24 DIAGNOSIS — M1712 Unilateral primary osteoarthritis, left knee: Secondary | ICD-10-CM | POA: Diagnosis not present

## 2017-01-24 DIAGNOSIS — M1711 Unilateral primary osteoarthritis, right knee: Secondary | ICD-10-CM | POA: Diagnosis not present

## 2017-01-31 DIAGNOSIS — M1712 Unilateral primary osteoarthritis, left knee: Secondary | ICD-10-CM | POA: Diagnosis not present

## 2017-01-31 DIAGNOSIS — M1711 Unilateral primary osteoarthritis, right knee: Secondary | ICD-10-CM | POA: Diagnosis not present

## 2017-02-01 DIAGNOSIS — I10 Essential (primary) hypertension: Secondary | ICD-10-CM | POA: Diagnosis not present

## 2017-02-01 DIAGNOSIS — E7800 Pure hypercholesterolemia, unspecified: Secondary | ICD-10-CM | POA: Diagnosis not present

## 2017-02-01 DIAGNOSIS — Z0001 Encounter for general adult medical examination with abnormal findings: Secondary | ICD-10-CM | POA: Diagnosis not present

## 2017-02-01 DIAGNOSIS — N183 Chronic kidney disease, stage 3 (moderate): Secondary | ICD-10-CM | POA: Diagnosis not present

## 2017-02-01 DIAGNOSIS — E559 Vitamin D deficiency, unspecified: Secondary | ICD-10-CM | POA: Diagnosis not present

## 2017-02-07 DIAGNOSIS — M1711 Unilateral primary osteoarthritis, right knee: Secondary | ICD-10-CM | POA: Diagnosis not present

## 2017-02-07 DIAGNOSIS — M1712 Unilateral primary osteoarthritis, left knee: Secondary | ICD-10-CM | POA: Diagnosis not present

## 2017-02-28 DIAGNOSIS — S338XXA Sprain of other parts of lumbar spine and pelvis, initial encounter: Secondary | ICD-10-CM | POA: Diagnosis not present

## 2017-03-09 DIAGNOSIS — M1711 Unilateral primary osteoarthritis, right knee: Secondary | ICD-10-CM | POA: Diagnosis not present

## 2017-06-29 DIAGNOSIS — L438 Other lichen planus: Secondary | ICD-10-CM | POA: Diagnosis not present

## 2017-07-05 DIAGNOSIS — L438 Other lichen planus: Secondary | ICD-10-CM | POA: Diagnosis not present

## 2017-07-11 DIAGNOSIS — S0512XA Contusion of eyeball and orbital tissues, left eye, initial encounter: Secondary | ICD-10-CM | POA: Diagnosis not present

## 2017-08-08 ENCOUNTER — Telehealth: Payer: Self-pay | Admitting: Cardiology

## 2017-08-08 NOTE — Telephone Encounter (Signed)
°  New Prob  New orders needed in Epic for labwork. Current ones are expired.

## 2017-08-08 NOTE — Telephone Encounter (Signed)
Left pt a message to call back. 

## 2017-08-08 NOTE — Telephone Encounter (Signed)
Pt is aware of labs orders and appointment date and time. Pt verbalized understanding.

## 2017-10-05 ENCOUNTER — Other Ambulatory Visit: Payer: 59 | Admitting: *Deleted

## 2017-10-05 DIAGNOSIS — E785 Hyperlipidemia, unspecified: Secondary | ICD-10-CM

## 2017-10-05 LAB — LIPID PANEL
CHOLESTEROL TOTAL: 137 mg/dL (ref 100–199)
Chol/HDL Ratio: 3.4 ratio (ref 0.0–5.0)
HDL: 40 mg/dL (ref >39)
LDL CALC: 78 mg/dL (ref 0–99)
TRIGLYCERIDES: 93 mg/dL (ref 0–149)
VLDL CHOLESTEROL CAL: 19 mg/dL (ref 5–40)

## 2017-10-05 LAB — ALT: ALT: 34 IU/L (ref 0–44)

## 2017-10-08 DIAGNOSIS — M1711 Unilateral primary osteoarthritis, right knee: Secondary | ICD-10-CM | POA: Diagnosis not present

## 2017-10-08 DIAGNOSIS — M1712 Unilateral primary osteoarthritis, left knee: Secondary | ICD-10-CM | POA: Diagnosis not present

## 2017-10-10 NOTE — Progress Notes (Signed)
Cardiology Office Note:    Date:  10/12/2017   ID:  Marcus Greer, DOB [birth year], MRN [id number]  PCP:  Lawerance Cruel, MD  Cardiologist:  No primary care provider on file.    Referring MD: Lawerance Cruel, MD   CC: Followup of CAD and hyperlipidemia   History of Present Illness:    Marcus Greer is a 67 y.o. male with a hx of ASCAD S/P PCI of the prox RCA with residual 40-50% distal RCA, dyslipidemia and GERD.  He is here today for followup and is doing well.  Hee denies any chest pain or pressure, SOB, DOE, PND, orthopnea, LE edema, dizziness, palpitations or syncope. He is compliant with her meds and is tolerating meds with no SE.  He goes to the gym and rides the bike for 15 minutes and then ellipiical for 30 minutes and then lifts weights and then the treadmill 3 times weekly.     Past Medical History:  Diagnosis Date  . ASCVD (arteriosclerotic cardiovascular disease)   . Coronary artery disease    s/p PCI of the prox RCA with residual 40-50% distal RCA 11/2002  . DJD (degenerative joint disease)   . GERD (gastroesophageal reflux disease)   . Hyperlipidemia     Past Surgical History:  Procedure Laterality Date  . CARDIAC CATHETERIZATION    . KNEE ARTHROCENTESIS    . NOSE SURGERY    . SHOULDER ARTHROSCOPY      Current Medications: Current Meds  Medication Sig  . aspirin EC 81 MG tablet Take 1 tablet (81 mg total) by mouth daily.  Marland Kitchen atorvastatin (LIPITOR) 20 MG tablet TAKE ONE TABLET BY MOUTH DAILY  . Cholecalciferol (VITAMIN D) 2000 units CAPS Take 1 capsule by mouth daily.  . Magnesium Oxide 250 MG TABS Take 1 tablet by mouth daily.  . Misc Natural Products (OSTEO BI-FLEX ADV DOUBLE ST) CAPS Take by mouth 2 (two) times daily.  . nitroGLYCERIN (NITROSTAT) 0.4 MG SL tablet Place 1 tablet (0.4 mg total) under the tongue every 5 (five) minutes as needed for chest pain.  . Omega-3 Fatty Acids (FISH OIL) 1000 MG CAPS Take 1 capsule by mouth daily.  . ramipril  (ALTACE) 5 MG capsule TAKE ONE CAPSULE BY MOUTH DAILY  . Turmeric 500 MG TABS Take 1 tablet by mouth daily.     Allergies:   Gemfibrozil; Niaspan [niacin er]; and Tape   Social History   Socioeconomic History  . Marital status: Married    Spouse name: None  . Number of children: None  . Years of education: None  . Highest education level: None  Social Needs  . Financial resource strain: None  . Food insecurity - worry: None  . Food insecurity - inability: None  . Transportation needs - medical: None  . Transportation needs - non-medical: None  Occupational History  . None  Tobacco Use  . Smoking status: Never Smoker  . Smokeless tobacco: Never Used  Substance and Sexual Activity  . Alcohol use: Yes    Comment: occsaional  . Drug use: None  . Sexual activity: None  Other Topics Concern  . None  Social History Narrative  . None     Family History: The patient's family history includes Diabetes in his father; Epilepsy in his sister.  ROS:   Please see the history of present illness.    Review of Systems  Neurological: Positive for headaches.    All other systems reviewed and negative.  EKGs/Labs/Other Studies Reviewed:    The following studies were reviewed today: none  EKG:  EKG is  ordered today.  The ekg ordered today demonstrates sinus bradycardia at 57bpm with no ST changes  Recent Labs: 10/05/2017: ALT 34   Recent Lipid Panel    Component Value Date/Time   CHOL 137 10/05/2017 0805   CHOL 124 07/25/2013 0750   TRIG 93 10/05/2017 0805   TRIG 157 (H) 07/25/2013 0750   HDL 40 10/05/2017 0805   HDL 41 07/25/2013 0750   CHOLHDL 3.4 10/05/2017 0805   CHOLHDL 3.6 08/16/2015 0754   VLDL 20 08/16/2015 0754   LDLCALC 78 10/05/2017 0805   LDLCALC 52 07/25/2013 0750    Physical Exam:    VS:  BP 118/78   Pulse 72   Ht 5\' 9"  (1.753 m)   Wt 214 lb 8 oz (97.3 kg)   SpO2 97%   BMI 31.68 kg/m     Wt Readings from Last 3 Encounters:  10/12/17 214 lb 8  oz (97.3 kg)  10/04/16 212 lb (96.2 kg)  08/18/15 205 lb (93 kg)     GEN:  Well nourished, well developed in no acute distress HEENT: Normal NECK: No JVD; No carotid bruits LYMPHATICS: No lymphadenopathy CARDIAC: RRR, no murmurs, rubs, gallops RESPIRATORY:  Clear to auscultation without rales, wheezing or rhonchi  ABDOMEN: Soft, non-tender, non-distended MUSCULOSKELETAL:  No edema; No deformity  SKIN: Warm and dry NEUROLOGIC:  Alert and oriented x 3 PSYCHIATRIC:  Normal affect   ASSESSMENT:    1. Coronary artery disease involving native coronary artery of native heart without angina pectoris   2. Pure hypercholesterolemia    PLAN:    In order of problems listed above:  1.  ASCAD -  S/P PCI of the prox RCA with residual 40-50% distal RCA.  He is doing well with no anginal symptoms. He will continue on ASA 81mg  daily and statin   2.  Hyperlipidemia with LDL goal < 70. His LDL was mildly elevated at 78 this month.  I encouraged him to follow a low fat, low carb diet.  He will continue on Lipitor 20mg  daily.    Medication Adjustments/Labs and Tests Ordered: Current medicines are reviewed at length with the patient today.  Concerns regarding medicines are outlined above.  Orders Placed This Encounter  Procedures  . EKG 12-Lead   No orders of the defined types were placed in this encounter.   Signed, Fransico Him, MD  10/12/2017 3:40 PM    [facility]

## 2017-10-12 ENCOUNTER — Encounter (INDEPENDENT_AMBULATORY_CARE_PROVIDER_SITE_OTHER): Payer: Self-pay

## 2017-10-12 ENCOUNTER — Ambulatory Visit (INDEPENDENT_AMBULATORY_CARE_PROVIDER_SITE_OTHER): Payer: 59 | Admitting: Cardiology

## 2017-10-12 ENCOUNTER — Encounter: Payer: Self-pay | Admitting: Cardiology

## 2017-10-12 VITALS — BP 118/78 | HR 72 | Ht 69.0 in | Wt 214.5 lb

## 2017-10-12 DIAGNOSIS — E7800 Pure hypercholesterolemia, unspecified: Secondary | ICD-10-CM

## 2017-10-12 DIAGNOSIS — I2510 Atherosclerotic heart disease of native coronary artery without angina pectoris: Secondary | ICD-10-CM | POA: Diagnosis not present

## 2017-10-12 NOTE — Patient Instructions (Signed)

## 2017-10-29 DIAGNOSIS — H903 Sensorineural hearing loss, bilateral: Secondary | ICD-10-CM | POA: Diagnosis not present

## 2017-11-22 ENCOUNTER — Other Ambulatory Visit: Payer: Self-pay | Admitting: Cardiology

## 2017-12-12 DIAGNOSIS — M170 Bilateral primary osteoarthritis of knee: Secondary | ICD-10-CM | POA: Diagnosis not present

## 2017-12-18 DIAGNOSIS — M1711 Unilateral primary osteoarthritis, right knee: Secondary | ICD-10-CM | POA: Diagnosis not present

## 2017-12-26 DIAGNOSIS — M1712 Unilateral primary osteoarthritis, left knee: Secondary | ICD-10-CM | POA: Diagnosis not present

## 2018-01-02 DIAGNOSIS — M1711 Unilateral primary osteoarthritis, right knee: Secondary | ICD-10-CM | POA: Diagnosis not present

## 2018-01-23 DIAGNOSIS — M1990 Unspecified osteoarthritis, unspecified site: Secondary | ICD-10-CM | POA: Diagnosis not present

## 2018-02-06 DIAGNOSIS — Z0000 Encounter for general adult medical examination without abnormal findings: Secondary | ICD-10-CM | POA: Diagnosis not present

## 2018-02-06 DIAGNOSIS — Z23 Encounter for immunization: Secondary | ICD-10-CM | POA: Diagnosis not present

## 2018-06-13 DIAGNOSIS — M170 Bilateral primary osteoarthritis of knee: Secondary | ICD-10-CM | POA: Diagnosis not present

## 2018-06-18 ENCOUNTER — Telehealth: Payer: Self-pay

## 2018-06-18 NOTE — Telephone Encounter (Signed)
   [facility] Pre-operative Risk Assessment    Request for surgical clearance:  1. What type of surgery is being performed? Right Total Knee    2. When is this surgery scheduled? 08/05/18   3. What type of clearance is required (medical clearance vs. Pharmacy clearance to hold med vs. Both)? Both   4. Are there any medications that need to be held prior to surgery and how long? Aspirin    5. Practice name and name of physician performing surgery? [facility], Dr. Wynelle Link    6. What is your office phone number: [phone]    7.   What is your office fax number: [phone]  8.   Anesthesia type (None, local, MAC, general) ? Choice    Marcus Greer Marcus Greer 06/18/2018, 3:01 PM  _________________________________________________________________   (provider comments below)

## 2018-06-21 NOTE — Telephone Encounter (Signed)
Spoke to pt re: surgical clearance and the need for an appt before he can be cleared Pt has been scheduled to see Ermalinda Barrios, PA-C, 06/26/18. Pt thanked me for the call.

## 2018-06-21 NOTE — Telephone Encounter (Signed)
   Primary Cardiologist:No primary care provider on file.  Chart reviewed as part of pre-operative protocol coverage. Because of Marcus Greer's past medical history and time since last visit, he/she will require a follow-up visit in order to better assess preoperative cardiovascular risk.  Pre-op covering staff: - Please schedule appointment and call patient to inform them. - Please contact requesting surgeon's office via preferred method (i.e, phone, fax) to inform them of need for appointment prior to surgery.  Cecilie Kicks, NP  06/21/2018, 11:06 AM

## 2018-06-25 ENCOUNTER — Encounter: Payer: Self-pay | Admitting: Physician Assistant

## 2018-06-26 ENCOUNTER — Encounter: Payer: Self-pay | Admitting: Physician Assistant

## 2018-06-26 ENCOUNTER — Ambulatory Visit (INDEPENDENT_AMBULATORY_CARE_PROVIDER_SITE_OTHER): Payer: 59 | Admitting: Physician Assistant

## 2018-06-26 VITALS — BP 116/82 | HR 58 | Ht 69.0 in | Wt 220.8 lb

## 2018-06-26 DIAGNOSIS — E785 Hyperlipidemia, unspecified: Secondary | ICD-10-CM | POA: Diagnosis not present

## 2018-06-26 DIAGNOSIS — I2510 Atherosclerotic heart disease of native coronary artery without angina pectoris: Secondary | ICD-10-CM

## 2018-06-26 DIAGNOSIS — Z01818 Encounter for other preprocedural examination: Secondary | ICD-10-CM | POA: Diagnosis not present

## 2018-06-26 NOTE — Patient Instructions (Addendum)
Medication Instructions:  Your physician recommends that you continue on your current medications as directed. Please refer to the Current Medication list given to you today.  If you need a refill on your cardiac medications before your next appointment, please call your pharmacy.   Lab work: Your physician recommends that you return for a FASTING lipid profile and liver function panel in January   If you have labs (blood work) drawn today and your tests are completely normal, you will receive your results only by: Marland Kitchen MyChart Message (if you have MyChart) OR . A paper copy in the mail If you have any lab test that is abnormal or we need to change your treatment, we will call you to review the results.  Testing/Procedures: None ordered  Follow-Up: At [facility], you and your health needs are our priority.  As part of our continuing mission to provide you with exceptional heart care, we have created designated Provider Care Teams.  These Care Teams include your primary Cardiologist (physician) and Advanced Practice Providers (APPs -  Physician Assistants and Nurse Practitioners) who all work together to provide you with the care you need, when you need it. . You will need a follow up appointment in 6 months.  Please call our office 2 months in advance to schedule this appointment.  You may see Fransico Him, MD or one of the following Advanced Practice Providers on your designated Care Team:   . Lyda Jester, PA-C . Dayna Dunn, PA-C . Ermalinda Barrios, PA-C  Any Other Special Instructions Will Be Listed Below (If Applicable).

## 2018-06-26 NOTE — Progress Notes (Signed)
Cardiology Office Note    Date:  06/26/2018   ID:  Marcus Greer, DOB 12-18-1950, MRN [id number]  PCP:  Lawerance Cruel, MD  Cardiologist: Fransico Him, MD EPS: None  Chief Complaint  Patient presents with  . Pre-op Exam    History of Present Illness:  Marcus Greer is a 67 y.o. male with history of CAD status post PCI/DES to the proximal RCA with residual 40 to 50% distal RCA 2004, hyperlipidemia and GERD.  Last saw Dr. Radford Pax 10/12/2017 and was doing well.  Ext 10/17/2016 upsloping ST depression no evidence of ischemia 9 minutes of exercise isolated PVC no chest pain low risk GXT with no EKG evidence of ischemia.  Patient comes in today for preoperative clearance before undergoing knee replacement by Dr. Maureen Ralphs August 05, 2018.Patient exercises regularly-1 hr 3 days/week riding exercise bike, ellyptical, and then does weights and treadmill.  Denies any chest pain, palpitations, dyspnea, dyspnea on exertion, dizziness or presyncope.  Feels younger than he did when he had his heart attack.    Past Medical History:  Diagnosis Date  . ASCVD (arteriosclerotic cardiovascular disease)   . Coronary artery disease    s/p PCI of the prox RCA with residual 40-50% distal RCA 11/2002  . DJD (degenerative joint disease)   . GERD (gastroesophageal reflux disease)   . Hyperlipidemia     Past Surgical History:  Procedure Laterality Date  . CARDIAC CATHETERIZATION    . KNEE ARTHROCENTESIS    . NOSE SURGERY    . SHOULDER ARTHROSCOPY      Current Medications: Current Meds  Medication Sig  . aspirin EC 81 MG tablet Take 1 tablet (81 mg total) by mouth daily.  Marland Kitchen atorvastatin (LIPITOR) 20 MG tablet TAKE ONE TABLET BY MOUTH DAILY  . Cholecalciferol (VITAMIN D) 2000 units CAPS Take 1 capsule by mouth daily.  . Magnesium Oxide 250 MG TABS Take 1 tablet by mouth daily.  . Misc Natural Products (OSTEO BI-FLEX ADV DOUBLE ST) CAPS Take by mouth 2 (two) times daily.  . nitroGLYCERIN  (NITROSTAT) 0.4 MG SL tablet Place 1 tablet (0.4 mg total) under the tongue every 5 (five) minutes as needed for chest pain.  . Omega-3 Fatty Acids (FISH OIL) 1000 MG CAPS Take 1 capsule by mouth daily.  . ramipril (ALTACE) 5 MG capsule TAKE ONE CAPSULE BY MOUTH DAILY  . Turmeric 500 MG TABS Take 1 tablet by mouth daily.     Allergies:   Gemfibrozil; Niaspan [niacin er]; and Tape   Social History   Socioeconomic History  . Marital status: Married    Spouse name: Not on file  . Number of children: Not on file  . Years of education: Not on file  . Highest education level: Not on file  Occupational History  . Not on file  Social Needs  . Financial resource strain: Not on file  . Food insecurity:    Worry: Not on file    Inability: Not on file  . Transportation needs:    Medical: Not on file    Non-medical: Not on file  Tobacco Use  . Smoking status: Never Smoker  . Smokeless tobacco: Never Used  Substance and Sexual Activity  . Alcohol use: Yes    Comment: occsaional  . Drug use: Never  . Sexual activity: Not on file  Lifestyle  . Physical activity:    Days per week: Not on file    Minutes per session: Not on file  .  Stress: Not on file  Relationships  . Social connections:    Talks on phone: Not on file    Gets together: Not on file    Attends religious service: Not on file    Active member of club or organization: Not on file    Attends meetings of clubs or organizations: Not on file    Relationship status: Not on file  Other Topics Concern  . Not on file  Social History Narrative  . Not on file     Family History:  The patient's family history includes Diabetes in his father; Epilepsy in his sister.   ROS:   Please see the history of present illness.    Review of Systems  Constitution: Negative.  HENT: Negative.   Cardiovascular: Negative.   Respiratory: Negative.   Endocrine: Negative.   Hematologic/Lymphatic: Negative.   Musculoskeletal: Positive for  joint pain.  Gastrointestinal: Negative.   Genitourinary: Negative.   Neurological: Negative.    All other systems reviewed and are negative.   PHYSICAL EXAM:   VS:  BP 116/82   Pulse (!) 58   Ht 5\' 9"  (1.753 m)   Wt 220 lb 12.8 oz (100.2 kg)   SpO2 95%   BMI 32.61 kg/m   Physical Exam  GEN: Well nourished, well developed, in no acute distress  Neck: no JVD, carotid bruits, or masses Cardiac:RRR; as of S4, no murmurs, rubs Respiratory:  clear to auscultation bilaterally, normal work of breathing GI: soft, nontender, nondistended, + BS Ext: without cyanosis, clubbing, or edema, Good distal pulses bilaterally Neuro:  Alert and Oriented x 3 Psych: euthymic mood, full affect  Wt Readings from Last 3 Encounters:  06/26/18 220 lb 12.8 oz (100.2 kg)  10/12/17 214 lb 8 oz (97.3 kg)  10/04/16 212 lb (96.2 kg)      Studies/Labs Reviewed:   EKG:  EKG is ordered today.  The ekg ordered today demonstrates sinus bradycardia at 58 bpm otherwise normal  Recent Labs: 10/05/2017: ALT 34   Lipid Panel    Component Value Date/Time   CHOL 137 10/05/2017 0805   CHOL 124 07/25/2013 0750   TRIG 93 10/05/2017 0805   TRIG 157 (H) 07/25/2013 0750   HDL 40 10/05/2017 0805   HDL 41 07/25/2013 0750   CHOLHDL 3.4 10/05/2017 0805   CHOLHDL 3.6 08/16/2015 0754   VLDL 20 08/16/2015 0754   LDLCALC 78 10/05/2017 0805   LDLCALC 52 07/25/2013 0750    Additional studies/ records that were reviewed today include:  GXT 10/17/2016  Study Highlights      Blood pressure responded appropriately during exercise.  Upsloping ST segment depression ST segment depression of 1 mm was noted during stress. No evidence of ischemia  9 minutes exercise, good overall effort  Isolated PVC noted during late distress. No chest pain  Overall low risk exercise treadmill test with no electrocardiographic evidence of ischemia   Candee Furbish, MD     Normal nuclear stress test 2011  ASSESSMENT:    1.  Preoperative clearance   2. Coronary artery disease involving native coronary artery of native heart without angina pectoris   3. Hyperlipidemia, unspecified hyperlipidemia type      PLAN:  In order of problems listed above:  Preoperative clearance before undergoing total knee replacement by Dr. Maureen Ralphs on 08/05/2018.  Remote stent to the RCA in 2004.  GXT 10/2016 normal without ischemia.  According to the revised cardiac risk index.  Operative risk of major cardiac events  is 0.9%.  His functional capacity is almost 9 METS.  He can proceed with surgery without any further cardiac work-up. According to the Revised Cardiac Risk Index (RCRI), his Perioperative Risk of Major Cardiac Event is (%): 0.9  His Functional Capacity in METs is: 8.97 according to the Duke Activity Status Index (DASI).  CAD status post PCI/DES to the proximal RCA with residual 40 to 50% distal RCA in 2004.  No anginal symptoms.  GXT 10/2016 without ischemia.  Hyperlipidemia LDL was 78 09/2017 on low-dose Lipitor 20 mg daily.  Patient does not tolerate higher doses of statins as he has muscle aches.  Will repeat lipid profile in January.  Follow-up with Dr. Radford Pax in 6 months.    Medication Adjustments/Labs and Tests Ordered: Current medicines are reviewed at length with the patient today.  Concerns regarding medicines are outlined above.  Medication changes, Labs and Tests ordered today are listed in the Patient Instructions below. Patient Instructions  Medication Instructions:  Your physician recommends that you continue on your current medications as directed. Please refer to the Current Medication list given to you today.  If you need a refill on your cardiac medications before your next appointment, please call your pharmacy.   Lab work: Your physician recommends that you return for a FASTING lipid profile and liver function panel in January   If you have labs (blood work) drawn today and your tests are completely  normal, you will receive your results only by: Marland Kitchen MyChart Message (if you have MyChart) OR . A paper copy in the mail If you have any lab test that is abnormal or we need to change your treatment, we will call you to review the results.  Testing/Procedures: None ordered  Follow-Up: At [facility], you and your health needs are our priority.  As part of our continuing mission to provide you with exceptional heart care, we have created designated Provider Care Teams.  These Care Teams include your primary Cardiologist (physician) and Advanced Practice Providers (APPs -  Physician Assistants and Nurse Practitioners) who all work together to provide you with the care you need, when you need it. . You will need a follow up appointment in 6 months.  Please call our office 2 months in advance to schedule this appointment.  You may see Fransico Him, MD or one of the following Advanced Practice Providers on your designated Care Team:   . Lyda Jester, PA-C . Dayna Dunn, PA-C . Ermalinda Barrios, PA-C  Any Other Special Instructions Will Be Listed Below (If Applicable).       Signed, Ermalinda Barrios, PA-C  06/26/2018 2:46 PM    [facility] [address], [place], [place]  [address] Phone: [phone]; Fax: [phone]

## 2018-07-19 NOTE — H&P (Signed)
TOTAL KNEE ADMISSION H&P  Patient is being admitted for right total knee arthroplasty.  Subjective:  Chief Complaint:right knee pain.  HPI: Marcus Greer, 67 y.o. male, has a history of pain and functional disability in the right knee due to arthritis and has failed non-surgical conservative treatments for greater than 12 weeks to includecorticosteriod injections, viscosupplementation injections and activity modification.  Onset of symptoms was gradual, starting >10 years ago with gradually worsening course since that time. The patient noted prior procedures on the knee to include  patellar alignment surgery on the right knee(s).  Patient currently rates pain in the right knee(s) at 5 out of 10 with activity. Patient has worsening of pain with activity and weight bearing, crepitus, joint swelling and instability.  Patient has evidence of significant medial joint space narrowing, just about bone-on-bone, as well as near bone-on-bone arthritis in the patellafemoral compartment by imaging studies. There is no active infection.  Patient Active Problem List   Diagnosis Date Noted  . Preoperative clearance 06/26/2018  . DOE (dyspnea on exertion) 10/04/2016  . Coronary artery disease   . Hyperlipidemia    Past Medical History:  Diagnosis Date  . ASCVD (arteriosclerotic cardiovascular disease)   . Coronary artery disease    s/p PCI of the prox RCA with residual 40-50% distal RCA 11/2002  . DJD (degenerative joint disease)   . GERD (gastroesophageal reflux disease)   . Hyperlipidemia     Past Surgical History:  Procedure Laterality Date  . CARDIAC CATHETERIZATION    . KNEE ARTHROCENTESIS    . NOSE SURGERY    . SHOULDER ARTHROSCOPY      No current facility-administered medications for this encounter.    Current Outpatient Medications  Medication Sig Dispense Refill Last Dose  . aspirin EC 81 MG tablet Take 1 tablet (81 mg total) by mouth daily. 90 tablet 3 Taking  . atorvastatin  (LIPITOR) 20 MG tablet TAKE ONE TABLET BY MOUTH DAILY 90 tablet 3 Taking  . Cholecalciferol (VITAMIN D) 2000 units CAPS Take 1 capsule by mouth daily.   Taking  . Magnesium Oxide 250 MG TABS Take 1 tablet by mouth daily.   Taking  . Misc Natural Products (OSTEO BI-FLEX ADV DOUBLE ST) CAPS Take by mouth 2 (two) times daily.   Taking  . nitroGLYCERIN (NITROSTAT) 0.4 MG SL tablet Place 1 tablet (0.4 mg total) under the tongue every 5 (five) minutes as needed for chest pain. 25 tablet 1 Taking  . Omega-3 Fatty Acids (FISH OIL) 1000 MG CAPS Take 1 capsule by mouth daily.   Taking  . ramipril (ALTACE) 5 MG capsule TAKE ONE CAPSULE BY MOUTH DAILY 90 capsule 3 Taking  . Turmeric 500 MG TABS Take 1 tablet by mouth daily.   Taking   Allergies  Allergen Reactions  . Gemfibrozil   . Niaspan [Niacin Er]   . Tape     unknown    Social History   Tobacco Use  . Smoking status: Never Smoker  . Smokeless tobacco: Never Used  Substance Use Topics  . Alcohol use: Yes    Comment: occsaional    Family History  Problem Relation Age of Onset  . Diabetes Father   . Epilepsy Sister      Review of Systems  Constitutional: Negative for chills and fever.  HENT: Negative for congestion, sore throat and tinnitus.   Eyes: Negative for double vision, photophobia and pain.  Respiratory: Negative for cough, shortness of breath and wheezing.  Cardiovascular: Negative for chest pain, palpitations and orthopnea.  Gastrointestinal: Negative for heartburn, nausea and vomiting.  Genitourinary: Negative for dysuria, frequency and urgency.  Musculoskeletal: Positive for joint pain.  Neurological: Negative for dizziness, weakness and headaches.    Objective:  Physical Exam  Well nourished and well developed. General: Alert and oriented x3, cooperative and pleasant, no acute distress. Head: normocephalic, atraumatic, neck supple. Eyes: EOMI. Respiratory: breath sounds clear in all fields, no wheezing, rales,  or rhonchi. Cardiovascular: Regular rate and rhythm, no murmurs, gallops or rubs.  Abdomen: non-tender to palpation and soft, normoactive bowel sounds. Musculoskeletal: Antalgic gait pattern favoring the right side without using assisted devices.  Right Hip Exam: ROM: Normal without discomfort. There is no tenderness over the greater trochanter bursa. Right Knee Exam: No effusion. Range of motion is 5-130 degrees. Moderate crepitus on range of motion of the knee. Positive medial, greater than lateral, joint line tenderness. Stable knee. Left Hip Exam: ROM: Normal without discomfort. There is no tenderness over the greater trochanter bursa.  Left Knee Exam: No effusion. Range of motion is 0-135 degrees. Slight crepitus on range of motion of the knee. Slight lateral, greater than medial, joint line tenderness. Stable knee. Calves soft and nontender. Motor function intact in LE. Strength 5/5 LE bilaterally. Neuro: Distal pulses 2+. Sensation to light touch intact in LE.  Vital signs in last 24 hours: Blood pressure: 148/90 mmHg Pulse: 64 bpm   Labs:   Estimated body mass index is 32.61 kg/m as calculated from the following:   Height as of 06/26/18: 5\' 9"  (1.753 m).   Weight as of 06/26/18: 100.2 kg.   Imaging Review Plain radiographs demonstrate severe degenerative joint disease of the right knee(s). The overall alignment isneutral. The bone quality appears to be adequate for age and reported activity level.   Preoperative templating of the joint replacement has been completed, documented, and submitted to the Operating Room personnel in order to optimize intra-operative equipment management.   Anticipated LOS equal to or greater than 2 midnights due to - Age 52 and older with one or more of the following:  - Obesity  - Expected need for hospital services (PT, OT, Nursing) required for safe  discharge  - Anticipated need for postoperative skilled nursing care or inpatient rehab  -  Active co-morbidities: Coronary Artery Disease OR   - Unanticipated findings during/Post Surgery: None  - Patient is a high risk of re-admission due to: None     Assessment/Plan:  End stage arthritis, right knee   The patient history, physical examination, clinical judgment of the provider and imaging studies are consistent with end stage degenerative joint disease of the right knee(s) and total knee arthroplasty is deemed medically necessary. The treatment options including medical management, injection therapy arthroscopy and arthroplasty were discussed at length. The risks and benefits of total knee arthroplasty were presented and reviewed. The risks due to aseptic loosening, infection, stiffness, patella tracking problems, thromboembolic complications and other imponderables were discussed. The patient acknowledged the explanation, agreed to proceed with the plan and consent was signed. Patient is being admitted for inpatient treatment for surgery, pain control, PT, OT, prophylactic antibiotics, VTE prophylaxis, progressive ambulation and ADL's and discharge planning. The patient is planning to be discharged home with outpatient physical therapy.   Therapy Plans: outpatient therapy at [facility] Disposition: Home with wife Planned DVT Prophylaxis: Xarelto 10 mg daily (hx DVT) DME needed: None PCP: Melinda Crutch, MD Cardiologist: Fransico Him, MD TXA: Topical (hx  DVT) Allergies: NKDA Anesthesia Concerns: None  - Patient was instructed on what medications to stop prior to surgery. - Follow-up visit in 2 weeks with Dr. Wynelle Link - Begin physical therapy following surgery - Pre-operative lab work as pre-surgical testing - Prescriptions will be provided in hospital at time of discharge  Theresa Duty, PA-C Orthopedic Surgery [facility] [place]

## 2018-07-29 NOTE — Patient Instructions (Addendum)
Marcus Greer  07/29/2018   Your procedure is scheduled on: 08-05-18   Report to [facility] Main  Entrance               Report to admitting at     1015 AM    Call this number if you have problems the morning of surgery [phone]    Remember: Do not eat food  :After Midnight. YOU MAY HAVE CLEAR LIQUIDS UNTIL 0745 AM THEN NOTHING BY MOUTH     CLEAR LIQUID DIET   Foods Allowed                                                                     Foods Excluded  Coffee and tea, regular and decaf                             liquids that you cannot  Plain Jell-O in any flavor                                             see through such as: Fruit ices (not with fruit pulp)                                     milk, soups, orange juice  Iced Popsicles                                    All solid food Carbonated beverages, regular and diet                                    Cranberry, grape and apple juices Sports drinks like Gatorade Lightly seasoned clear broth or consume(fat free) Sugar, honey syrup  _____________________________________________________________________    BRUSH YOUR TEETH MORNING OF SURGERY AND RINSE YOUR MOUTH OUT, NO CHEWING GUM CANDY OR MINTS.     Take these medicines the morning of surgery with A SIP OF WATER: NONE                                 You may not have any metal on your body including hair pins and              piercings  Do not wear jewelry, make-up, lotions, powders or perfumes, deodorant             Do not wear nail polish.  Do not shave  48 hours prior to surgery.              Men may shave face and neck.   Do not bring valuables to the hospital. [place]  FOR VALUABLES.  Contacts, dentures or bridgework may not be worn into surgery.  Leave suitcase in the car. After surgery it may be brought to your room.     Patients discharged the day of surgery will not be allowed to  drive home.  Name and phone number of your driver:  Special Instructions: N/A              Please read over the following fact sheets you were given: _____________________________________________________________________             [facility] - Preparing for Surgery Before surgery, you can play an important role.  Because skin is not sterile, your skin needs to be as free of germs as possible.  You can reduce the number of germs on your skin by washing with CHG (chlorahexidine gluconate) soap before surgery.  CHG is an antiseptic cleaner which kills germs and bonds with the skin to continue killing germs even after washing. Please DO NOT use if you have an allergy to CHG or antibacterial soaps.  If your skin becomes reddened/irritated stop using the CHG and inform your nurse when you arrive at Short Stay. Do not shave (including legs and underarms) for at least 48 hours prior to the first CHG shower.  You may shave your face/neck. Please follow these instructions carefully:  1.  Shower with CHG Soap the night before surgery and the  morning of Surgery.  2.  If you choose to wash your hair, wash your hair first as usual with your  normal  shampoo.  3.  After you shampoo, rinse your hair and body thoroughly to remove the  shampoo.                           4.  Use CHG as you would any other liquid soap.  You can apply chg directly  to the skin and wash                       Gently with a scrungie or clean washcloth.  5.  Apply the CHG Soap to your body ONLY FROM THE NECK DOWN.   Do not use on face/ open                           Wound or open sores. Avoid contact with eyes, ears mouth and genitals (private parts).                       Wash face,  Genitals (private parts) with your normal soap.             6.  Wash thoroughly, paying special attention to the area where your surgery  will be performed.  7.  Thoroughly rinse your body with warm water from the neck down.  8.  DO NOT shower/wash  with your normal soap after using and rinsing off  the CHG Soap.                9.  Pat yourself dry with a clean towel.            10.  Wear clean pajamas.            11.  Place clean sheets on your bed the night of your first shower and do not  sleep with pets. Day of Surgery : Do not apply  any lotions/deodorants the morning of surgery.  Please wear clean clothes to the hospital/surgery center.  FAILURE TO FOLLOW THESE INSTRUCTIONS MAY RESULT IN THE CANCELLATION OF YOUR SURGERY PATIENT SIGNATURE_________________________________  NURSE SIGNATURE__________________________________  ________________________________________________________________________  WHAT IS A BLOOD TRANSFUSION? Blood Transfusion Information  A transfusion is the replacement of blood or some of its parts. Blood is made up of multiple cells which provide different functions.  Red blood cells carry oxygen and are used for blood loss replacement.  White blood cells fight against infection.  Platelets control bleeding.  Plasma helps clot blood.  Other blood products are available for specialized needs, such as hemophilia or other clotting disorders. BEFORE THE TRANSFUSION  Who gives blood for transfusions?   Healthy volunteers who are fully evaluated to make sure their blood is safe. This is blood bank blood. Transfusion therapy is the safest it has ever been in the practice of medicine. Before blood is taken from a donor, a complete history is taken to make sure that person has no history of diseases nor engages in risky social behavior (examples are intravenous drug use or sexual activity with multiple partners). The donor's travel history is screened to minimize risk of transmitting infections, such as malaria. The donated blood is tested for signs of infectious diseases, such as HIV and hepatitis. The blood is then tested to be sure it is compatible with you in order to minimize the chance of a transfusion reaction.  If you or a relative donates blood, this is often done in anticipation of surgery and is not appropriate for emergency situations. It takes many days to process the donated blood. RISKS AND COMPLICATIONS Although transfusion therapy is very safe and saves many lives, the main dangers of transfusion include:   Getting an infectious disease.  Developing a transfusion reaction. This is an allergic reaction to something in the blood you were given. Every precaution is taken to prevent this. The decision to have a blood transfusion has been considered carefully by your caregiver before blood is given. Blood is not given unless the benefits outweigh the risks. AFTER THE TRANSFUSION  Right after receiving a blood transfusion, you will usually feel much better and more energetic. This is especially true if your red blood cells have gotten low (anemic). The transfusion raises the level of the red blood cells which carry oxygen, and this usually causes an energy increase.  The nurse administering the transfusion will monitor you carefully for complications. HOME CARE INSTRUCTIONS  No special instructions are needed after a transfusion. You may find your energy is better. Speak with your caregiver about any limitations on activity for underlying diseases you may have. SEEK MEDICAL CARE IF:   Your condition is not improving after your transfusion.  You develop redness or irritation at the intravenous (IV) site. SEEK IMMEDIATE MEDICAL CARE IF:  Any of the following symptoms occur over the next 12 hours:  Shaking chills.  You have a temperature by mouth above 102 F (38.9 C), not controlled by medicine.  Chest, back, or muscle pain.  People around you feel you are not acting correctly or are confused.  Shortness of breath or difficulty breathing.  Dizziness and fainting.  You get a rash or develop hives.  You have a decrease in urine output.  Your urine turns a dark color or changes to  pink, red, or brown. Any of the following symptoms occur over the next 10 days:  You have a temperature by mouth above 102 F (  38.9 C), not controlled by medicine.  Shortness of breath.  Weakness after normal activity.  The white part of the eye turns yellow (jaundice).  You have a decrease in the amount of urine or are urinating less often.  Your urine turns a dark color or changes to pink, red, or brown. Document Released: 08/25/2000 Document Revised: 11/20/2011 Document Reviewed: 04/13/2008 ExitCare Patient Information 2014 [place].  _______________________________________________________________________  Incentive Spirometer  An incentive spirometer is a tool that can help keep your lungs clear and active. This tool measures how well you are filling your lungs with each breath. Taking long deep breaths may help reverse or decrease the chance of developing breathing (pulmonary) problems (especially infection) following:  A long period of time when you are unable to move or be active. BEFORE THE PROCEDURE   If the spirometer includes an indicator to show your best effort, your nurse or respiratory therapist will set it to a desired goal.  If possible, sit up straight or lean slightly forward. Try not to slouch.  Hold the incentive spirometer in an upright position. INSTRUCTIONS FOR USE  1. Sit on the edge of your bed if possible, or sit up as far as you can in bed or on a chair. 2. Hold the incentive spirometer in an upright position. 3. Breathe out normally. 4. Place the mouthpiece in your mouth and seal your lips tightly around it. 5. Breathe in slowly and as deeply as possible, raising the piston or the ball toward the top of the column. 6. Hold your breath for 3-5 seconds or for as long as possible. Allow the piston or ball to fall to the bottom of the column. 7. Remove the mouthpiece from your mouth and breathe out normally. 8. Rest for a few seconds and repeat  Steps 1 through 7 at least 10 times every 1-2 hours when you are awake. Take your time and take a few normal breaths between deep breaths. 9. The spirometer may include an indicator to show your best effort. Use the indicator as a goal to work toward during each repetition. 10. After each set of 10 deep breaths, practice coughing to be sure your lungs are clear. If you have an incision (the cut made at the time of surgery), support your incision when coughing by placing a pillow or rolled up towels firmly against it. Once you are able to get out of bed, walk around indoors and cough well. You may stop using the incentive spirometer when instructed by your caregiver.  RISKS AND COMPLICATIONS  Take your time so you do not get dizzy or light-headed.  If you are in pain, you may need to take or ask for pain medication before doing incentive spirometry. It is harder to take a deep breath if you are having pain. AFTER USE  Rest and breathe slowly and easily.  It can be helpful to keep track of a log of your progress. Your caregiver can provide you with a simple table to help with this. If you are using the spirometer at home, follow these instructions: [place] IF:   You are having difficultly using the spirometer.  You have trouble using the spirometer as often as instructed.  Your pain medication is not giving enough relief while using the spirometer.  You develop fever of 100.5 F (38.1 C) or higher. SEEK IMMEDIATE MEDICAL CARE IF:   You cough up bloody sputum that had not been present before.  You develop fever  of 102 F (38.9 C) or greater.  You develop worsening pain at or near the incision site. MAKE SURE YOU:   Understand these instructions.  Will watch your condition.  Will get help right away if you are not doing well or get worse. Document Released: 01/08/2007 Document Revised: 11/20/2011 Document Reviewed: 03/11/2007 [facility] Patient Information 2014  [place], [place].   ________________________________________________________________________

## 2018-07-29 NOTE — Progress Notes (Signed)
Clearance Dr. Gus Height 06-20-18 on chart  . LOV notes 02-06-18 on chatr  Clearance Estella Husk PA-C 06-26-18 epic ekg 06-26-18 epic Stress 10-17-16 epic

## 2018-07-30 ENCOUNTER — Other Ambulatory Visit: Payer: Self-pay

## 2018-07-30 ENCOUNTER — Encounter (HOSPITAL_COMMUNITY): Payer: Self-pay

## 2018-07-30 ENCOUNTER — Encounter (HOSPITAL_COMMUNITY)
Admission: RE | Admit: 2018-07-30 | Discharge: 2018-07-30 | Disposition: A | Discharge status: Home / Self Care | Payer: 59 | Source: Ambulatory Visit | Attending: Orthopedic Surgery | Admitting: Orthopedic Surgery

## 2018-07-30 DIAGNOSIS — Z01812 Encounter for preprocedural laboratory examination: Secondary | ICD-10-CM | POA: Insufficient documentation

## 2018-07-30 DIAGNOSIS — M1711 Unilateral primary osteoarthritis, right knee: Secondary | ICD-10-CM | POA: Diagnosis not present

## 2018-07-30 HISTORY — DX: Malignant (primary) neoplasm, unspecified: C80.1

## 2018-07-30 LAB — CBC
HEMATOCRIT: 50.9 % (ref 39.0–52.0)
HEMOGLOBIN: 17.1 g/dL — AB (ref 13.0–17.0)
MCH: 31.3 pg (ref 26.0–34.0)
MCHC: 33.6 g/dL (ref 30.0–36.0)
MCV: 93.2 fL (ref 80.0–100.0)
Platelets: 188 10*3/uL (ref 150–400)
RBC: 5.46 MIL/uL (ref 4.22–5.81)
RDW: 12.1 % (ref 11.5–15.5)
WBC: 7.6 10*3/uL (ref 4.0–10.5)
nRBC: 0 % (ref 0.0–0.2)

## 2018-07-30 LAB — COMPREHENSIVE METABOLIC PANEL
ALBUMIN: 4.4 g/dL (ref 3.5–5.0)
ALK PHOS: 41 U/L (ref 38–126)
ALT: 42 U/L (ref 0–44)
ANION GAP: 10 (ref 5–15)
AST: 39 U/L (ref 15–41)
BILIRUBIN TOTAL: 0.9 mg/dL (ref 0.3–1.2)
BUN: 23 mg/dL (ref 8–23)
CALCIUM: 9.2 mg/dL (ref 8.9–10.3)
CO2: 24 mmol/L (ref 22–32)
CREATININE: 1.24 mg/dL (ref 0.61–1.24)
Chloride: 106 mmol/L (ref 98–111)
GFR calc Af Amer: >60 mL/min (ref >60)
GFR calc non Af Amer: 59 mL/min — ABNORMAL LOW (ref >60)
GLUCOSE: 126 mg/dL — AB (ref 70–99)
Potassium: 4.1 mmol/L (ref 3.5–5.1)
Sodium: 140 mmol/L (ref 135–145)
Total Protein: 7.3 g/dL (ref 6.5–8.1)

## 2018-07-30 LAB — PROTIME-INR
INR: 0.96
Prothrombin Time: 12.6 seconds (ref 11.4–15.2)

## 2018-07-30 LAB — APTT: aPTT: 30 seconds (ref 24–36)

## 2018-07-30 LAB — SURGICAL PCR SCREEN
MRSA, PCR: NEGATIVE
STAPHYLOCOCCUS AUREUS: NEGATIVE

## 2018-07-31 LAB — ABO/RH: ABO/RH(D): A POS

## 2018-08-04 MED ORDER — BUPIVACAINE LIPOSOME 1.3 % IJ SUSP
20.0000 mL | INTRAMUSCULAR | Status: DC
  Filled 2018-08-04: qty 20

## 2018-08-05 ENCOUNTER — Encounter (HOSPITAL_COMMUNITY)
Admission: RE | Disposition: A | Discharge status: Home / Self Care | Payer: Self-pay | Source: Ambulatory Visit | Attending: Orthopedic Surgery

## 2018-08-05 ENCOUNTER — Other Ambulatory Visit: Payer: Self-pay

## 2018-08-05 ENCOUNTER — Observation Stay (HOSPITAL_COMMUNITY)
Admission: RE | Admit: 2018-08-05 | Discharge: 2018-08-07 | Disposition: A | Discharge status: Home / Self Care | Payer: 59 | Source: Ambulatory Visit | Attending: Orthopedic Surgery | Admitting: Orthopedic Surgery

## 2018-08-05 ENCOUNTER — Encounter (HOSPITAL_COMMUNITY): Payer: Self-pay | Admitting: *Deleted

## 2018-08-05 ENCOUNTER — Ambulatory Visit (HOSPITAL_COMMUNITY): Payer: 59 | Admitting: Certified Registered"

## 2018-08-05 DIAGNOSIS — Z888 Allergy status to other drugs, medicaments and biological substances status: Secondary | ICD-10-CM | POA: Insufficient documentation

## 2018-08-05 DIAGNOSIS — M179 Osteoarthritis of knee, unspecified: Secondary | ICD-10-CM

## 2018-08-05 DIAGNOSIS — Z79899 Other long term (current) drug therapy: Secondary | ICD-10-CM | POA: Insufficient documentation

## 2018-08-05 DIAGNOSIS — I2510 Atherosclerotic heart disease of native coronary artery without angina pectoris: Secondary | ICD-10-CM | POA: Diagnosis not present

## 2018-08-05 DIAGNOSIS — K760 Fatty (change of) liver, not elsewhere classified: Secondary | ICD-10-CM | POA: Insufficient documentation

## 2018-08-05 DIAGNOSIS — M1710 Unilateral primary osteoarthritis, unspecified knee: Secondary | ICD-10-CM | POA: Diagnosis present

## 2018-08-05 DIAGNOSIS — R42 Dizziness and giddiness: Secondary | ICD-10-CM | POA: Insufficient documentation

## 2018-08-05 DIAGNOSIS — Z7901 Long term (current) use of anticoagulants: Secondary | ICD-10-CM | POA: Diagnosis not present

## 2018-08-05 DIAGNOSIS — K219 Gastro-esophageal reflux disease without esophagitis: Secondary | ICD-10-CM | POA: Diagnosis not present

## 2018-08-05 DIAGNOSIS — N281 Cyst of kidney, acquired: Secondary | ICD-10-CM | POA: Diagnosis not present

## 2018-08-05 DIAGNOSIS — M1711 Unilateral primary osteoarthritis, right knee: Secondary | ICD-10-CM | POA: Diagnosis not present

## 2018-08-05 DIAGNOSIS — Z7982 Long term (current) use of aspirin: Secondary | ICD-10-CM | POA: Diagnosis not present

## 2018-08-05 DIAGNOSIS — E785 Hyperlipidemia, unspecified: Secondary | ICD-10-CM | POA: Insufficient documentation

## 2018-08-05 HISTORY — PX: TOTAL KNEE ARTHROPLASTY: SHX125

## 2018-08-05 LAB — TYPE AND SCREEN
ABO/RH(D): A POS
Antibody Screen: NEGATIVE

## 2018-08-05 SURGERY — ARTHROPLASTY, KNEE, TOTAL
Anesthesia: Spinal | Site: Knee | Laterality: Right

## 2018-08-05 MED ORDER — RAMIPRIL 5 MG PO CAPS
5.0000 mg | ORAL_CAPSULE | Freq: Every day | ORAL | Status: DC
  Administered 2018-08-06 – 2018-08-07 (×2): 5 mg via ORAL
  Filled 2018-08-05 (×2): qty 1

## 2018-08-05 MED ORDER — CEFAZOLIN SODIUM-DEXTROSE 2-4 GM/100ML-% IV SOLN
2.0000 g | Freq: Four times a day (QID) | INTRAVENOUS | Status: AC
  Administered 2018-08-05 – 2018-08-06 (×2): 2 g via INTRAVENOUS
  Filled 2018-08-05 (×2): qty 100

## 2018-08-05 MED ORDER — OXYCODONE HCL 5 MG PO TABS: 5.0000 mg | ORAL_TABLET | Freq: Once | ORAL | Status: DC | PRN

## 2018-08-05 MED ORDER — PHENOL 1.4 % MT LIQD
1.0000 | OROMUCOSAL | Status: DC | PRN
  Filled 2018-08-05: qty 177

## 2018-08-05 MED ORDER — PROPOFOL 10 MG/ML IV BOLUS
INTRAVENOUS | Status: DC | PRN
  Administered 2018-08-05 (×2): 10 mg via INTRAVENOUS

## 2018-08-05 MED ORDER — SODIUM CHLORIDE (PF) 0.9 % IJ SOLN
INTRAMUSCULAR | Status: DC | PRN
  Administered 2018-08-05: 60 mL

## 2018-08-05 MED ORDER — 0.9 % SODIUM CHLORIDE (POUR BTL) OPTIME
TOPICAL | Status: DC | PRN
  Administered 2018-08-05: 1000 mL

## 2018-08-05 MED ORDER — TRAMADOL HCL 50 MG PO TABS
50.0000 mg | ORAL_TABLET | Freq: Four times a day (QID) | ORAL | Status: DC | PRN
  Administered 2018-08-07: 100 mg via ORAL
  Filled 2018-08-05: qty 2

## 2018-08-05 MED ORDER — DIPHENHYDRAMINE HCL 12.5 MG/5ML PO ELIX: 12.5000 mg | ORAL_SOLUTION | ORAL | Status: DC | PRN

## 2018-08-05 MED ORDER — OXYCODONE HCL 5 MG PO TABS
10.0000 mg | ORAL_TABLET | ORAL | Status: DC | PRN
  Administered 2018-08-07: 10 mg via ORAL
  Filled 2018-08-05: qty 2

## 2018-08-05 MED ORDER — ACETAMINOPHEN 10 MG/ML IV SOLN
1000.0000 mg | Freq: Once | INTRAVENOUS | Status: AC
  Administered 2018-08-05: 1000 mg via INTRAVENOUS
  Filled 2018-08-05: qty 100

## 2018-08-05 MED ORDER — DEXAMETHASONE SODIUM PHOSPHATE 10 MG/ML IJ SOLN
INTRAMUSCULAR | Status: AC
  Filled 2018-08-05: qty 1

## 2018-08-05 MED ORDER — ROPIVACAINE HCL 5 MG/ML IJ SOLN
INTRAMUSCULAR | Status: DC | PRN
  Administered 2018-08-05: 20 mL via PERINEURAL

## 2018-08-05 MED ORDER — POLYETHYLENE GLYCOL 3350 17 G PO PACK: 17.0000 g | PACK | Freq: Every day | ORAL | Status: DC | PRN

## 2018-08-05 MED ORDER — FENTANYL CITRATE (PF) 100 MCG/2ML IJ SOLN
50.0000 ug | Freq: Once | INTRAMUSCULAR | Status: AC
  Administered 2018-08-05: 100 ug via INTRAVENOUS
  Filled 2018-08-05: qty 2

## 2018-08-05 MED ORDER — PROPOFOL 500 MG/50ML IV EMUL
INTRAVENOUS | Status: DC | PRN
  Administered 2018-08-05: 100 ug/kg/min via INTRAVENOUS

## 2018-08-05 MED ORDER — FENTANYL CITRATE (PF) 100 MCG/2ML IJ SOLN
INTRAMUSCULAR | Status: AC
  Filled 2018-08-05: qty 2

## 2018-08-05 MED ORDER — DEXAMETHASONE SODIUM PHOSPHATE 10 MG/ML IJ SOLN
10.0000 mg | Freq: Once | INTRAMUSCULAR | Status: AC
  Administered 2018-08-06: 10 mg via INTRAVENOUS
  Filled 2018-08-05: qty 1

## 2018-08-05 MED ORDER — BUPIVACAINE LIPOSOME 1.3 % IJ SUSP
INTRAMUSCULAR | Status: DC | PRN
  Administered 2018-08-05: 20 mL

## 2018-08-05 MED ORDER — SODIUM CHLORIDE (PF) 0.9 % IJ SOLN
INTRAMUSCULAR | Status: AC
  Filled 2018-08-05: qty 10

## 2018-08-05 MED ORDER — CHLORHEXIDINE GLUCONATE 4 % EX LIQD
60.0000 mL | Freq: Once | CUTANEOUS | Status: AC
  Administered 2018-08-05: 4 via TOPICAL

## 2018-08-05 MED ORDER — MIDAZOLAM HCL 2 MG/2ML IJ SOLN
1.0000 mg | Freq: Once | INTRAMUSCULAR | Status: AC
  Administered 2018-08-05: 2 mg via INTRAVENOUS
  Filled 2018-08-05: qty 2

## 2018-08-05 MED ORDER — SODIUM CHLORIDE 0.9 % IV SOLN
INTRAVENOUS | Status: DC
  Administered 2018-08-05 – 2018-08-06 (×2): via INTRAVENOUS

## 2018-08-05 MED ORDER — PROPOFOL 10 MG/ML IV BOLUS
INTRAVENOUS | Status: AC
  Filled 2018-08-05: qty 80

## 2018-08-05 MED ORDER — METHOCARBAMOL 500 MG IVPB - SIMPLE MED
INTRAVENOUS | Status: AC
  Filled 2018-08-05: qty 50

## 2018-08-05 MED ORDER — OXYCODONE HCL 5 MG PO TABS
5.0000 mg | ORAL_TABLET | ORAL | Status: DC | PRN
  Administered 2018-08-05 – 2018-08-07 (×8): 10 mg via ORAL
  Filled 2018-08-05 (×8): qty 2

## 2018-08-05 MED ORDER — ONDANSETRON HCL 4 MG/2ML IJ SOLN
INTRAMUSCULAR | Status: AC
  Filled 2018-08-05: qty 2

## 2018-08-05 MED ORDER — METHOCARBAMOL 500 MG IVPB - SIMPLE MED
500.0000 mg | Freq: Four times a day (QID) | INTRAVENOUS | Status: DC | PRN
  Administered 2018-08-05: 500 mg via INTRAVENOUS
  Filled 2018-08-05: qty 50

## 2018-08-05 MED ORDER — SODIUM CHLORIDE 0.9 % IR SOLN
Status: DC | PRN
  Administered 2018-08-05: 1000 mL

## 2018-08-05 MED ORDER — ATORVASTATIN CALCIUM 20 MG PO TABS
20.0000 mg | ORAL_TABLET | Freq: Every day | ORAL | Status: DC
  Administered 2018-08-05 – 2018-08-06 (×2): 20 mg via ORAL
  Filled 2018-08-05 (×2): qty 1

## 2018-08-05 MED ORDER — DEXAMETHASONE SODIUM PHOSPHATE 10 MG/ML IJ SOLN
8.0000 mg | Freq: Once | INTRAMUSCULAR | Status: AC
  Administered 2018-08-05: 8 mg via INTRAVENOUS

## 2018-08-05 MED ORDER — RIVAROXABAN 10 MG PO TABS
10.0000 mg | ORAL_TABLET | Freq: Every day | ORAL | Status: DC
  Administered 2018-08-06 – 2018-08-07 (×2): 10 mg via ORAL
  Filled 2018-08-05 (×2): qty 1

## 2018-08-05 MED ORDER — ONDANSETRON HCL 4 MG PO TABS: 4.0000 mg | ORAL_TABLET | Freq: Four times a day (QID) | ORAL | Status: DC | PRN

## 2018-08-05 MED ORDER — TRANEXAMIC ACID 1000 MG/10ML IV SOLN
2000.0000 mg | INTRAVENOUS | Status: DC
  Filled 2018-08-05: qty 20

## 2018-08-05 MED ORDER — DOCUSATE SODIUM 100 MG PO CAPS
100.0000 mg | ORAL_CAPSULE | Freq: Two times a day (BID) | ORAL | Status: DC
  Administered 2018-08-05 – 2018-08-07 (×4): 100 mg via ORAL
  Filled 2018-08-05 (×4): qty 1

## 2018-08-05 MED ORDER — OXYCODONE HCL 5 MG/5ML PO SOLN
5.0000 mg | Freq: Once | ORAL | Status: DC | PRN
  Filled 2018-08-05: qty 5

## 2018-08-05 MED ORDER — MORPHINE SULFATE (PF) 2 MG/ML IV SOLN
1.0000 mg | INTRAVENOUS | Status: DC | PRN
  Administered 2018-08-05: 1 mg via INTRAVENOUS
  Filled 2018-08-05: qty 1

## 2018-08-05 MED ORDER — MENTHOL 3 MG MT LOZG: 1.0000 | LOZENGE | OROMUCOSAL | Status: DC | PRN

## 2018-08-05 MED ORDER — METOCLOPRAMIDE HCL 5 MG/ML IJ SOLN: 5.0000 mg | Freq: Three times a day (TID) | INTRAMUSCULAR | Status: DC | PRN

## 2018-08-05 MED ORDER — ONDANSETRON HCL 4 MG/2ML IJ SOLN: 4.0000 mg | Freq: Once | INTRAMUSCULAR | Status: DC | PRN

## 2018-08-05 MED ORDER — ACETAMINOPHEN 500 MG PO TABS
1000.0000 mg | ORAL_TABLET | Freq: Four times a day (QID) | ORAL | Status: AC
  Administered 2018-08-05 – 2018-08-06 (×4): 1000 mg via ORAL
  Filled 2018-08-05 (×4): qty 2

## 2018-08-05 MED ORDER — GABAPENTIN 300 MG PO CAPS
300.0000 mg | ORAL_CAPSULE | Freq: Once | ORAL | Status: AC
  Administered 2018-08-05: 300 mg via ORAL
  Filled 2018-08-05: qty 1

## 2018-08-05 MED ORDER — METHOCARBAMOL 500 MG PO TABS
500.0000 mg | ORAL_TABLET | Freq: Four times a day (QID) | ORAL | Status: DC | PRN
  Administered 2018-08-05 – 2018-08-07 (×4): 500 mg via ORAL
  Filled 2018-08-05 (×4): qty 1

## 2018-08-05 MED ORDER — ONDANSETRON HCL 4 MG/2ML IJ SOLN: 4.0000 mg | Freq: Four times a day (QID) | INTRAMUSCULAR | Status: DC | PRN

## 2018-08-05 MED ORDER — ONDANSETRON HCL 4 MG/2ML IJ SOLN
INTRAMUSCULAR | Status: DC | PRN
  Administered 2018-08-05: 4 mg via INTRAVENOUS

## 2018-08-05 MED ORDER — STERILE WATER FOR IRRIGATION IR SOLN
Status: DC | PRN
  Administered 2018-08-05: 2000 mL

## 2018-08-05 MED ORDER — SODIUM CHLORIDE (PF) 0.9 % IJ SOLN
INTRAMUSCULAR | Status: AC
  Filled 2018-08-05: qty 50

## 2018-08-05 MED ORDER — CEFAZOLIN SODIUM-DEXTROSE 2-4 GM/100ML-% IV SOLN
2.0000 g | INTRAVENOUS | Status: AC
  Administered 2018-08-05: 2 g via INTRAVENOUS
  Filled 2018-08-05: qty 100

## 2018-08-05 MED ORDER — FENTANYL CITRATE (PF) 100 MCG/2ML IJ SOLN
25.0000 ug | INTRAMUSCULAR | Status: DC | PRN
  Administered 2018-08-05 (×2): 50 ug via INTRAVENOUS

## 2018-08-05 MED ORDER — LACTATED RINGERS IV SOLN
INTRAVENOUS | Status: DC
  Administered 2018-08-05: 12:00:00 via INTRAVENOUS

## 2018-08-05 MED ORDER — FLEET ENEMA 7-19 GM/118ML RE ENEM: 1.0000 | ENEMA | Freq: Once | RECTAL | Status: DC | PRN

## 2018-08-05 MED ORDER — METOCLOPRAMIDE HCL 5 MG PO TABS: 5.0000 mg | ORAL_TABLET | Freq: Three times a day (TID) | ORAL | Status: DC | PRN

## 2018-08-05 MED ORDER — BISACODYL 10 MG RE SUPP: 10.0000 mg | Freq: Every day | RECTAL | Status: DC | PRN

## 2018-08-05 SURGICAL SUPPLY — 64 items
ATTUNE MED DOME PAT 38 KNEE (Knees) ×2 IMPLANT
ATTUNE MED DOME PAT 38MM KNEE (Knees) ×1 IMPLANT
ATTUNE PS FEM RT SZ 7 CEM KNEE (Femur) ×3 IMPLANT
ATTUNE PSRP INSR SZ7 8 KNEE (Insert) ×2 IMPLANT
ATTUNE PSRP INSR SZ7 8MM KNEE (Insert) ×1 IMPLANT
BAG ZIPLOCK 12X15 (MISCELLANEOUS) ×3 IMPLANT
BANDAGE ACE 6X5 VEL STRL LF (GAUZE/BANDAGES/DRESSINGS) ×3 IMPLANT
BASE TIBIA ATTUNE KNEE SYS SZ6 (Knees) ×1 IMPLANT
BLADE SAG 18X100X1.27 (BLADE) ×3 IMPLANT
BLADE SAW SGTL 11.0X1.19X90.0M (BLADE) ×3 IMPLANT
BOWL SMART MIX CTS (DISPOSABLE) ×3 IMPLANT
CEMENT HV SMART SET (Cement) ×6 IMPLANT
CLOSURE WOUND 1/2 X4 (GAUZE/BANDAGES/DRESSINGS) ×2
COVER SURGICAL LIGHT HANDLE (MISCELLANEOUS) ×3 IMPLANT
COVER WAND RF STERILE (DRAPES) ×3 IMPLANT
CUFF TOURN SGL QUICK 34 (TOURNIQUET CUFF) ×2
CUFF TRNQT CYL 34X4X40X1 (TOURNIQUET CUFF) ×1 IMPLANT
DECANTER SPIKE VIAL GLASS SM (MISCELLANEOUS) ×3 IMPLANT
DRAPE U-SHAPE 47X51 STRL (DRAPES) ×3 IMPLANT
DRSG ADAPTIC 3X8 NADH LF (GAUZE/BANDAGES/DRESSINGS) ×3 IMPLANT
DRSG PAD ABDOMINAL 8X10 ST (GAUZE/BANDAGES/DRESSINGS) ×3 IMPLANT
DURAPREP 26ML APPLICATOR (WOUND CARE) ×3 IMPLANT
ELECT REM PT RETURN 15FT ADLT (MISCELLANEOUS) ×3 IMPLANT
EVACUATOR 1/8 PVC DRAIN (DRAIN) ×3 IMPLANT
GAUZE SPONGE 4X4 12PLY STRL (GAUZE/BANDAGES/DRESSINGS) ×3 IMPLANT
GLOVE BIO SURGEON STRL SZ7 (GLOVE) ×3 IMPLANT
GLOVE BIO SURGEON STRL SZ8 (GLOVE) ×3 IMPLANT
GLOVE BIO SURGEON STRL SZ8.5 (GLOVE) ×3 IMPLANT
GLOVE BIOGEL PI IND STRL 6.5 (GLOVE) ×1 IMPLANT
GLOVE BIOGEL PI IND STRL 7.0 (GLOVE) ×2 IMPLANT
GLOVE BIOGEL PI IND STRL 7.5 (GLOVE) ×2 IMPLANT
GLOVE BIOGEL PI IND STRL 8 (GLOVE) ×1 IMPLANT
GLOVE BIOGEL PI IND STRL 9 (GLOVE) ×1 IMPLANT
GLOVE BIOGEL PI INDICATOR 6.5 (GLOVE) ×2
GLOVE BIOGEL PI INDICATOR 7.0 (GLOVE) ×4
GLOVE BIOGEL PI INDICATOR 7.5 (GLOVE) ×4
GLOVE BIOGEL PI INDICATOR 8 (GLOVE) ×2
GLOVE BIOGEL PI INDICATOR 9 (GLOVE) ×2
GLOVE SURG SS PI 6.5 STRL IVOR (GLOVE) ×3 IMPLANT
GLOVE SURG SS PI 7.0 STRL IVOR (GLOVE) ×3 IMPLANT
GOWN SPEC L3 XXLG W/TWL (GOWN DISPOSABLE) ×3 IMPLANT
GOWN STRL REUS W/TWL LRG LVL3 (GOWN DISPOSABLE) ×9 IMPLANT
GOWN STRL REUS W/TWL XL LVL3 (GOWN DISPOSABLE) ×3 IMPLANT
HANDPIECE INTERPULSE COAX TIP (DISPOSABLE) ×2
HOLDER FOLEY CATH W/STRAP (MISCELLANEOUS) ×3 IMPLANT
IMMOBILIZER KNEE 20 (SOFTGOODS) ×6 IMPLANT
IMMOBILIZER KNEE 20 THIGH 36 (SOFTGOODS) ×1 IMPLANT
MANIFOLD NEPTUNE II (INSTRUMENTS) ×3 IMPLANT
PACK TOTAL KNEE CUSTOM (KITS) ×3 IMPLANT
PAD ABD 8X10 STRL (GAUZE/BANDAGES/DRESSINGS) ×3 IMPLANT
PADDING CAST COTTON 6X4 STRL (CAST SUPPLIES) ×9 IMPLANT
PIN STEINMAN FIXATION KNEE (PIN) ×3 IMPLANT
PROTECTOR NERVE ULNAR (MISCELLANEOUS) ×3 IMPLANT
SET HNDPC FAN SPRY TIP SCT (DISPOSABLE) ×1 IMPLANT
STRIP CLOSURE SKIN 1/2X4 (GAUZE/BANDAGES/DRESSINGS) ×4 IMPLANT
SUT MNCRL AB 4-0 PS2 18 (SUTURE) ×3 IMPLANT
SUT STRATAFIX 0 PDS 27 VIOLET (SUTURE) ×3
SUT VIC AB 2-0 CT1 27 (SUTURE) ×6
SUT VIC AB 2-0 CT1 TAPERPNT 27 (SUTURE) ×3 IMPLANT
SUTURE STRATFX 0 PDS 27 VIOLET (SUTURE) ×1 IMPLANT
TIBIA ATTUNE KNEE SYS BASE SZ6 (Knees) ×3 IMPLANT
TRAY FOLEY MTR SLVR 16FR STAT (SET/KITS/TRAYS/PACK) ×3 IMPLANT
WRAP KNEE MAXI GEL POST OP (GAUZE/BANDAGES/DRESSINGS) ×3 IMPLANT
YANKAUER SUCT BULB TIP 10FT TU (MISCELLANEOUS) ×3 IMPLANT

## 2018-08-05 NOTE — Progress Notes (Signed)
Assisted with  Dr. Christella Hartigan right, ultrasound guided, adductor canal block. Side rails up, monitors on throughout procedure. See vital signs in flow sheet. Tolerated Procedure well.

## 2018-08-05 NOTE — Anesthesia Postprocedure Evaluation (Signed)
Anesthesia Post Note  Patient: Marcus Greer  Procedure(s) Performed: RIGHT TOTAL KNEE ARTHROPLASTY (Right Knee)     Patient location during evaluation: PACU Anesthesia Type: Spinal Level of consciousness: oriented and awake and alert Pain management: pain level controlled Vital Signs Assessment: post-procedure vital signs reviewed and stable Respiratory status: spontaneous breathing, respiratory function stable, nonlabored ventilation and patient connected to nasal cannula oxygen Cardiovascular status: blood pressure returned to baseline and stable Postop Assessment: no headache, no backache, no apparent nausea or vomiting and spinal receding Anesthetic complications: no    Last Vitals:  Vitals:   08/05/18 1615 08/05/18 1634  BP: 128/86 138/89  Pulse: 62 (!) 53  Resp: 10 16  Temp: 36.5 C (!) 36.3 C  SpO2: 100% 99%    Last Pain:  Vitals:   08/05/18 1808  TempSrc:   PainSc: 4                  Lidia Collum

## 2018-08-05 NOTE — Anesthesia Procedure Notes (Signed)
Date/Time: 08/05/2018 1:55 PM Performed by: Niel Hummer, CRNA Pre-anesthesia Checklist: Patient being monitored, Suction available, Emergency Drugs available and Patient identified Oxygen Delivery Method: Simple face mask

## 2018-08-05 NOTE — Anesthesia Procedure Notes (Signed)
Anesthesia Regional Block: Adductor canal block   Pre-Anesthetic Checklist: ,, timeout performed, Correct Patient, Correct Site, Correct Laterality, Correct Procedure, Correct Position, site marked, Risks and benefits discussed,  Surgical consent,  Pre-op evaluation,  At surgeon's request and post-op pain management  Laterality: Right  Prep: chloraprep       Needles:  Injection technique: Single-shot  Needle Type: Echogenic Stimulator Needle     Needle Length: 10cm  Needle Gauge: 21     Additional Needles:   Procedures:,,,, ultrasound used (permanent image in chart),,,,  Narrative:  Start time: 08/05/2018 1:20 PM End time: 08/05/2018 1:26 PM Injection made incrementally with aspirations every 5 mL.  Performed by: Personally  Anesthesiologist: Lidia Collum, MD  Additional Notes: Monitors applied. Injection made in 5cc increments. No resistance to injection. Good needle visualization. Patient tolerated procedure well.

## 2018-08-05 NOTE — Progress Notes (Signed)
PT Cancellation Note  Patient Details Name: Marcus Greer MRN: [id number] DOB: Nov 14, 1950   Cancelled Treatment:    Reason Eval/Treat Not Completed: Other (comment);Medical issues which prohibited therapy(Pt given IV morphine prior to PT session. Pt complaining of lightheadedness and feeling near-syncopal, first BP 132/109. After 2-3 minutes, second BP taken and was 74/49. RN notified, will take manual BP. Pt also with pallor and deferring PT. )   Julien Girt, PT Acute Rehabilitation Services Pager [phone]  Office [phone]    Marcus Greer 08/05/2018, 7:41 PM

## 2018-08-05 NOTE — Anesthesia Preprocedure Evaluation (Signed)
Anesthesia Evaluation  Patient identified by MRN, date of birth, ID band Patient awake    Reviewed: Allergy & Precautions, NPO status , Patient's Chart, lab work & pertinent test results  History of Anesthesia Complications Negative for: history of anesthetic complications  Airway Mallampati: II  TM Distance: >3 FB Neck ROM: Full    Dental no notable dental hx.    Pulmonary neg pulmonary ROS,    Pulmonary exam normal        Cardiovascular Exercise Tolerance: Good METS: 7 - 9 Mets hypertension, + CAD, + Past MI and + Cardiac Stents (2004- DES to RCA)  Normal cardiovascular exam  Stress test 2018: Blood pressure responded appropriately during exercise. Upsloping ST segment depression ST segment depression of 1 mm was noted during stress. No evidence of ischemia 9 minutes exercise, good overall effort Isolated PVC noted during late distress. No chest pain Overall low risk exercise treadmill test with no electrocardiographic evidence of ischemia   Neuro/Psych negative neurological ROS  negative psych ROS   GI/Hepatic Neg liver ROS, GERD  ,  Endo/Other  negative endocrine ROS  Renal/GU negative Renal ROS  negative genitourinary   Musculoskeletal  (+) Arthritis , Osteoarthritis,    Abdominal   Peds  Hematology negative hematology ROS (+)   Anesthesia Other Findings   Reproductive/Obstetrics                            Anesthesia Physical Anesthesia Plan  ASA: III  Anesthesia Plan: Spinal   Post-op Pain Management:    Induction:   PONV Risk Score and Plan: 1 and Propofol infusion, TIVA and Treatment may vary due to age or medical condition  Airway Management Planned: Nasal Cannula, Natural Airway and Simple Face Mask  Additional Equipment: None  Intra-op Plan:   Post-operative Plan:   Informed Consent: I have reviewed the patients History and Physical, chart, labs and  discussed the procedure including the risks, benefits and alternatives for the proposed anesthesia with the patient or authorized representative who has indicated his/her understanding and acceptance.     Plan Discussed with:   Anesthesia Plan Comments:        Anesthesia Quick Evaluation

## 2018-08-05 NOTE — Op Note (Signed)
OPERATIVE REPORT-TOTAL KNEE ARTHROPLASTY   Pre-operative diagnosis- Osteoarthritis  Right knee(s)  Post-operative diagnosis- Osteoarthritis Right knee(s)  Procedure-  Right  Total Knee Arthroplasty  Surgeon- Dione Plover. Jeromy Borcherding, MD  Assistant- Theresa Duty, PA-C   Anesthesia-  Adductor canal block and spinal  EBL-50 mL   Drains Hemovac  Tourniquet time-  Total Tourniquet Time Documented: Thigh (Right) - 40 minutes Total: Thigh (Right) - 40 minutes     Complications- None  Condition-PACU - hemodynamically stable.   Brief Clinical Note  Marcus Greer is a 67 y.o. year old male with end stage OA of his right knee with progressively worsening pain and dysfunction. He has constant pain, with activity and at rest and significant functional deficits with difficulties even with ADLs. He has had extensive non-op management including analgesics, injections of cortisone and viscosupplements, and home exercise program, but remains in significant pain with significant dysfunction. Radiographs show bone on bone arthritis medial and patellofemoral. He presents now for right Total Knee Arthroplasty.    Procedure in detail---   The patient is brought into the operating room and positioned supine on the operating table. After successful administration of  Adductor canal block and spinal,   a tourniquet is placed high on the  Right thigh(s) and the lower extremity is prepped and draped in the usual sterile fashion. Time out is performed by the operating team and then the  Right lower extremity is wrapped in Esmarch, knee flexed and the tourniquet inflated to 300 mmHg.       A midline incision is made with a ten blade through the subcutaneous tissue to the level of the extensor mechanism. A fresh blade is used to make a medial parapatellar arthrotomy. Soft tissue over the proximal medial tibia is subperiosteally elevated to the joint line with a knife and into the semimembranosus bursa with a  Cobb elevator. Soft tissue over the proximal lateral tibia is elevated with attention being paid to avoiding the patellar tendon on the tibial tubercle. The patella is everted, knee flexed 90 degrees and the ACL and PCL are removed. Findings are bone on bone medial and patellofemoral with large global osteophytes.        The drill is used to create a starting hole in the distal femur and the canal is thoroughly irrigated with sterile saline to remove the fatty contents. The 5 degree Right  valgus alignment guide is placed into the femoral canal and the distal femoral cutting block is pinned to remove 9 mm off the distal femur. Resection is made with an oscillating saw.      The tibia is subluxed forward and the menisci are removed. The extramedullary alignment guide is placed referencing proximally at the medial aspect of the tibial tubercle and distally along the second metatarsal axis and tibial crest. The block is pinned to remove 53mm off the more deficient medial  side. Resection is made with an oscillating saw. Size 6is the most appropriate size for the tibia and the proximal tibia is prepared with the modular drill and keel punch for that size.      The femoral sizing guide is placed and size 7 is most appropriate. Rotation is marked off the epicondylar axis and confirmed by creating a rectangular flexion gap at 90 degrees. The size 7 cutting block is pinned in this rotation and the anterior, posterior and chamfer cuts are made with the oscillating saw. The intercondylar block is then placed and that cut is made.  Trial size 6 tibial component, trial size 7 posterior stabilized femur and a 8  mm posterior stabilized rotating platform insert trial is placed. Full extension is achieved with excellent varus/valgus and anterior/posterior balance throughout full range of motion. The patella is everted and thickness measured to be 24  mm. Free hand resection is taken to 14 mm, a 38 template is placed, lug  holes are drilled, trial patella is placed, and it tracks normally. Osteophytes are removed off the posterior femur with the trial in place. All trials are removed and the cut bone surfaces prepared with pulsatile lavage. Cement is mixed and once ready for implantation, the size 6 tibial implant, size  7 posterior stabilized femoral component, and the size 35 patella are cemented in place and the patella is held with the clamp. The trial insert is placed and the knee held in full extension. The Exparel (20 ml mixed with 60 ml saline) is injected into the extensor mechanism, posterior capsule, medial and lateral gutters and subcutaneous tissues.  All extruded cement is removed and once the cement is hard the permanent 8 mm posterior stabilized rotating platform insert is placed into the tibial tray.      The wound is copiously irrigated with saline solution and the extensor mechanism closed over a hemovac drain with #1 V-loc suture. The tourniquet is released for a total tourniquet time of 40  minutes. Flexion against gravity is 140 degrees and the patella tracks normally. Subcutaneous tissue is closed with 2.0 vicryl and subcuticular with running 4.0 Monocryl. The incision is cleaned and dried and steri-strips and a bulky sterile dressing are applied. The limb is placed into a knee immobilizer and the patient is awakened and transported to recovery in stable condition.      Please note that a surgical assistant was a medical necessity for this procedure in order to perform it in a safe and expeditious manner. Surgical assistant was necessary to retract the ligaments and vital neurovascular structures to prevent injury to them and also necessary for proper positioning of the limb to allow for anatomic placement of the prosthesis.   Dione Plover Airika Alkhatib, MD    08/05/2018, 3:06 PM

## 2018-08-05 NOTE — Transfer of Care (Signed)
Immediate Anesthesia Transfer of Care Note  Patient: Marcus Greer  Procedure(s) Performed: RIGHT TOTAL KNEE ARTHROPLASTY (Right Knee)  Patient Location: PACU  Anesthesia Type:Spinal  Level of Consciousness: awake, alert  and oriented  Airway & Oxygen Therapy: Patient Spontanous Breathing and Patient connected to face mask oxygen  Post-op Assessment: Report given to RN and Post -op Vital signs reviewed and stable  Post vital signs: Reviewed and stable  Last Vitals:  Vitals Value Taken Time  BP    Temp    Pulse 66 08/05/2018  3:34 PM  Resp 18 08/05/2018  3:34 PM  SpO2 97 % 08/05/2018  3:34 PM  Vitals shown include unvalidated device data.  Last Pain:  Vitals:   08/05/18 1137  TempSrc:   PainSc: 0-No pain         Complications: No apparent anesthesia complications

## 2018-08-05 NOTE — Discharge Instructions (Addendum)
° °Dr. Frank Aluisio °Total Joint Specialist °[facility] °[address] °[place], [place] [address] °[phone] ° °TOTAL KNEE REPLACEMENT POSTOPERATIVE DIRECTIONS ° °Knee Rehabilitation, Guidelines Following Surgery  °Results after knee surgery are often greatly improved when you follow the exercise, range of motion and muscle strengthening exercises prescribed by your doctor. Safety measures are also important to protect the knee from further injury. Any time any of these exercises cause you to have increased pain or swelling in your knee joint, decrease the amount until you are comfortable again and slowly increase them. If you have problems or questions, call your caregiver or physical therapist for advice.  ° °HOME CARE INSTRUCTIONS  °• Remove items at home which could result in a fall. This includes throw rugs or furniture in walking pathways.  °· ICE to the affected knee every three hours for 30 minutes at a time and then as needed for pain and swelling.  Continue to use ice on the knee for pain and swelling from surgery. You may notice swelling that will progress down to the foot and ankle.  This is normal after surgery.  Elevate the leg when you are not up walking on it.   °· Continue to use the breathing machine which will help keep your temperature down.  It is common for your temperature to cycle up and down following surgery, especially at night when you are not up moving around and exerting yourself.  The breathing machine keeps your lungs expanded and your temperature down. °· Do not place pillow under knee, focus on keeping the knee straight while resting ° °DIET °You may resume your previous home diet once your are discharged from the hospital. ° °DRESSING / WOUND CARE / SHOWERING °You may shower 3 days after surgery, but keep the wounds dry during showering.  You may use an occlusive plastic wrap (Press'n Seal for example), NO SOAKING/SUBMERGING IN THE BATHTUB.  If the bandage  gets wet, change with a clean dry gauze.  If the incision gets wet, pat the wound dry with a clean towel. °You may start showering once you are discharged home but do not submerge the incision under water. Just pat the incision dry and apply a dry gauze dressing on daily. °Change the surgical dressing daily and reapply a dry dressing each time. ° °ACTIVITY °Walk with your walker as instructed. °Use walker as long as suggested by your caregivers. °Avoid periods of inactivity such as sitting longer than an hour when not asleep. This helps prevent blood clots.  °You may resume a sexual relationship in one month or when given the OK by your doctor.  °You may return to work once you are cleared by your doctor.  °Do not drive a car for 6 weeks or until released by you surgeon.  °Do not drive while taking narcotics. ° °WEIGHT BEARING °Weight bearing as tolerated with assist device (walker, cane, etc) as directed, use it as long as suggested by your surgeon or therapist, typically at least 4-6 weeks. ° °POSTOPERATIVE CONSTIPATION PROTOCOL °Constipation - defined medically as fewer than three stools per week and severe constipation as less than one stool per week. ° °One of the most common issues patients have following surgery is constipation.  Even if you have a regular bowel pattern at home, your normal regimen is likely to be disrupted due to multiple reasons following surgery.  Combination of anesthesia, postoperative narcotics, change in appetite and fluid intake all can affect your bowels.    In order to avoid complications following surgery, here are some recommendations in order to help you during your recovery period. ° °Colace (docusate) - Pick up an over-the-counter form of Colace or another stool softener and take twice a day as long as you are requiring postoperative pain medications.  Take with a full glass of water daily.  If you experience loose stools or diarrhea, hold the colace until you stool forms back  up.  If your symptoms do not get better within 1 week or if they get worse, check with your doctor. ° °Dulcolax (bisacodyl) - Pick up over-the-counter and take as directed by the product packaging as needed to assist with the movement of your bowels.  Take with a full glass of water.  Use this product as needed if not relieved by Colace only.  ° °MiraLax (polyethylene glycol) - Pick up over-the-counter to have on hand.  MiraLax is a solution that will increase the amount of water in your bowels to assist with bowel movements.  Take as directed and can mix with a glass of water, juice, soda, coffee, or tea.  Take if you go more than two days without a movement. °Do not use MiraLax more than once per day. Call your doctor if you are still constipated or irregular after using this medication for 7 days in a row. ° °If you continue to have problems with postoperative constipation, please contact the office for further assistance and recommendations.  If you experience "the worst abdominal pain ever" or develop nausea or vomiting, please contact the office immediatly for further recommendations for treatment. ° °ITCHING ° If you experience itching with your medications, try taking only a single pain pill, or even half a pain pill at a time.  You can also use Benadryl over the counter for itching or also to help with sleep.  ° °TED HOSE STOCKINGS °Wear the elastic stockings on both legs for three weeks following surgery during the day but you may remove then at night for sleeping. ° °MEDICATIONS °See your medication summary on the “After Visit Summary” that the nursing staff will review with you prior to discharge.  You may have some home medications which will be placed on hold until you complete the course of blood thinner medication.  It is important for you to complete the blood thinner medication as prescribed by your surgeon.  Continue your approved medications as instructed at time of discharge. ° °PRECAUTIONS °If  you experience chest pain or shortness of breath - call 911 immediately for transfer to the hospital emergency department.  °If you develop a fever greater that 101 F, purulent drainage from wound, increased redness or drainage from wound, foul odor from the wound/dressing, or calf pain - CONTACT YOUR SURGEON.   °                                                °FOLLOW-UP APPOINTMENTS °Make sure you keep all of your appointments after your operation with your surgeon and caregivers. You should call the office at the above phone number and make an appointment for approximately two weeks after the date of your surgery or on the date instructed by your surgeon outlined in the "After Visit Summary". ° ° °RANGE OF MOTION AND STRENGTHENING EXERCISES  °Rehabilitation of the knee is important following a knee injury or   an operation. After just a few days of immobilization, the muscles of the thigh which control the knee become weakened and shrink (atrophy). Knee exercises are designed to build up the tone and strength of the thigh muscles and to improve knee motion. Often times heat used for twenty to thirty minutes before working out will loosen up your tissues and help with improving the range of motion but do not use heat for the first two weeks following surgery. These exercises can be done on a training (exercise) mat, on the floor, on a table or on a bed. Use what ever works the best and is most comfortable for you Knee exercises include:  °• Leg Lifts - While your knee is still immobilized in a splint or cast, you can do straight leg raises. Lift the leg to 60 degrees, hold for 3 sec, and slowly lower the leg. Repeat 10-20 times 2-3 times daily. Perform this exercise against resistance later as your knee gets better.  °• Quad and Hamstring Sets - Tighten up the muscle on the front of the thigh (Quad) and hold for 5-10 sec. Repeat this 10-20 times hourly. Hamstring sets are done by pushing the foot backward against an  object and holding for 5-10 sec. Repeat as with quad sets.  °· Leg Slides: Lying on your back, slowly slide your foot toward your buttocks, bending your knee up off the floor (only go as far as is comfortable). Then slowly slide your foot back down until your leg is flat on the floor again. °· Angel Wings: Lying on your back spread your legs to the side as far apart as you can without causing discomfort.  °A rehabilitation program following serious knee injuries can speed recovery and prevent re-injury in the future due to weakened muscles. Contact your doctor or a physical therapist for more information on knee rehabilitation.  ° °IF YOU ARE TRANSFERRED TO A SKILLED REHAB FACILITY °If the patient is transferred to a skilled rehab facility following release from the hospital, a list of the current medications will be sent to the facility for the patient to continue.  When discharged from the skilled rehab facility, please have the facility set up the patient's Home Health Physical Therapy prior to being released. Also, the skilled facility will be responsible for providing the patient with their medications at time of release from the facility to include their pain medication, the muscle relaxants, and their blood thinner medication. If the patient is still at the rehab facility at time of the two week follow up appointment, the skilled rehab facility will also need to assist the patient in arranging follow up appointment in our office and any transportation needs. ° °MAKE SURE YOU:  °• Understand these instructions.  °• Get help right away if you are not doing well or get worse.  ° ° °Pick up stool softner and laxative for home use following surgery while on pain medications. °Do not submerge incision under water. °Please use good hand washing techniques while changing dressing each day. °May shower starting three days after surgery. °Please use a clean towel to pat the incision dry following showers. °Continue to  use ice for pain and swelling after surgery. °Do not use any lotions or creams on the incision until instructed by your surgeon. ° ° °_____________________________________________ ° °Information on my medicine - XARELTO® (Rivaroxaban) ° °This medication education was reviewed with me or my healthcare representative as part of my discharge preparation.  ° °Why was   Xarelto® prescribed for you? °Xarelto® was prescribed for you to reduce the risk of blood clots forming after orthopedic surgery. The medical term for these abnormal blood clots is venous thromboembolism (VTE). ° °What do you need to know about xarelto® ? °Take your Xarelto® ONCE DAILY at the same time every day. °You may take it either with or without food. ° °If you have difficulty swallowing the tablet whole, you may crush it and mix in applesauce just prior to taking your dose. ° °Take Xarelto® exactly as prescribed by your doctor and DO NOT stop taking Xarelto® without talking to the doctor who prescribed the medication.  Stopping without other VTE prevention medication to take the place of Xarelto® may increase your risk of developing a clot. ° °After discharge, you should have regular check-up appointments with your healthcare provider that is prescribing your Xarelto®.   ° °What do you do if you miss a dose? °If you miss a dose, take it as soon as you remember on the same day then continue your regularly scheduled once daily regimen the next day. Do not take two doses of Xarelto® on the same day.  ° °Important Safety Information °A possible side effect of Xarelto® is bleeding. You should call your healthcare provider right away if you experience any of the following: °? Bleeding from an injury or your nose that does not stop. °? Unusual colored urine (red or dark brown) or unusual colored stools (red or black). °? Unusual bruising for unknown reasons. °? A serious fall or if you hit your head (even if there is no bleeding). ° °Some medicines may  interact with Xarelto® and might increase your risk of bleeding while on Xarelto®. To help avoid this, consult your healthcare provider or pharmacist prior to using any new prescription or non-prescription medications, including herbals, vitamins, non-steroidal anti-inflammatory drugs (NSAIDs) and supplements. ° °This website has more information on Xarelto®: www.xarelto.com. ° ° °

## 2018-08-05 NOTE — Interval H&P Note (Signed)
History and Physical Interval Note:  08/05/2018 11:36 AM  Marcus Greer  has presented today for surgery, with the diagnosis of right knee osteoarthritis  The various methods of treatment have been discussed with the patient and family. After consideration of risks, benefits and other options for treatment, the patient has consented to  Procedure(s) with comments: RIGHT TOTAL KNEE ARTHROPLASTY (Right) - 2min as a surgical intervention .  The patient's history has been reviewed, patient examined, no change in status, stable for surgery.  I have reviewed the patient's chart and labs.  Questions were answered to the patient's satisfaction.     Pilar Plate Cordie Beazley

## 2018-08-06 ENCOUNTER — Encounter (HOSPITAL_COMMUNITY): Payer: Self-pay | Admitting: Orthopedic Surgery

## 2018-08-06 DIAGNOSIS — M1711 Unilateral primary osteoarthritis, right knee: Secondary | ICD-10-CM | POA: Diagnosis not present

## 2018-08-06 LAB — CBC
HCT: 44.3 % (ref 39.0–52.0)
HEMOGLOBIN: 14.5 g/dL (ref 13.0–17.0)
MCH: 31.2 pg (ref 26.0–34.0)
MCHC: 32.7 g/dL (ref 30.0–36.0)
MCV: 95.3 fL (ref 80.0–100.0)
PLATELETS: 198 10*3/uL (ref 150–400)
RBC: 4.65 MIL/uL (ref 4.22–5.81)
RDW: 12 % (ref 11.5–15.5)
WBC: 18.6 10*3/uL — AB (ref 4.0–10.5)
nRBC: 0 % (ref 0.0–0.2)

## 2018-08-06 LAB — BASIC METABOLIC PANEL
Anion gap: 11 (ref 5–15)
BUN: 18 mg/dL (ref 8–23)
CHLORIDE: 106 mmol/L (ref 98–111)
CO2: 21 mmol/L — ABNORMAL LOW (ref 22–32)
CREATININE: 1.25 mg/dL — AB (ref 0.61–1.24)
Calcium: 8.7 mg/dL — ABNORMAL LOW (ref 8.9–10.3)
GFR, EST NON AFRICAN AMERICAN: 59 mL/min — AB (ref >60)
Glucose, Bld: 137 mg/dL — ABNORMAL HIGH (ref 70–99)
Potassium: 4.6 mmol/L (ref 3.5–5.1)
SODIUM: 138 mmol/L (ref 135–145)

## 2018-08-06 MED ORDER — OXYCODONE HCL 5 MG PO TABS: 5.0000 mg | ORAL_TABLET | Freq: Four times a day (QID) | ORAL | 0 refills | Status: DC | PRN

## 2018-08-06 MED ORDER — TRAMADOL HCL 50 MG PO TABS: 50.0000 mg | ORAL_TABLET | Freq: Four times a day (QID) | ORAL | 0 refills | Status: DC | PRN

## 2018-08-06 MED ORDER — METHOCARBAMOL 500 MG PO TABS: 500.0000 mg | ORAL_TABLET | Freq: Four times a day (QID) | ORAL | 0 refills | Status: DC | PRN

## 2018-08-06 MED ORDER — RIVAROXABAN 10 MG PO TABS: 10.0000 mg | ORAL_TABLET | Freq: Every day | ORAL | 0 refills | Status: DC

## 2018-08-06 NOTE — Progress Notes (Signed)
   Subjective: 1 Day Post-Op Procedure(s) (LRB): RIGHT TOTAL KNEE ARTHROPLASTY (Right) Patient reports pain as mild.   Patient seen in rounds by Dr. Wynelle Greer. Patient is well, and has had no acute complaints or problems. Had issues with lightheadedness and hypotension yesterday with physical therapy, feeling much better this AM. Denies chest pain, SOB, or calf pain. Foley catheter removed this AM. We will start therapy today.   Objective: Vital signs in last 24 hours: Temp:  [97.4 F (36.3 C)-98.7 F (37.1 C)] 97.7 F (36.5 C) (11/26 0557) Pulse Rate:  [53-74] 69 (11/26 0557) Resp:  [10-24] 16 (11/26 0557) BP: (95-140)/(67-92) 120/80 (11/26 0557) SpO2:  [94 %-100 %] 98 % (11/26 0557) Weight:  [99 kg] 99 kg (11/25 1137)  Intake/Output from previous day:  Intake/Output Summary (Last 24 hours) at 08/06/2018 0759 Last data filed at 08/06/2018 0600 Gross per 24 hour  Intake 3509.41 ml  Output 2265 ml  Net 1244.41 ml    Labs: Recent Labs    08/06/18 0419  HGB 14.5   Recent Labs    08/06/18 0419  WBC 18.6*  RBC 4.65  HCT 44.3  PLT 198   Recent Labs    08/06/18 0419  NA 138  K 4.6  CL 106  CO2 21*  BUN 18  CREATININE 1.25*  GLUCOSE 137*  CALCIUM 8.7*   Exam: General - Patient is Alert and Oriented Extremity - Neurologically intact Neurovascular intact Sensation intact distally Dorsiflexion/Plantar flexion intact Dressing - Dressing with moderate bloody drainage Motor Function - intact, moving foot and toes well on exam.   Past Medical History:  Diagnosis Date  . ASCVD (arteriosclerotic cardiovascular disease)   . Cancer (HCC)    basal cell nose  . Coronary artery disease    s/p PCI of the prox RCA with residual 40-50% distal RCA 11/2002  . DJD (degenerative joint disease)   . GERD (gastroesophageal reflux disease)   . Hyperlipidemia     Assessment/Plan: 1 Day Post-Op Procedure(s) (LRB): RIGHT TOTAL KNEE ARTHROPLASTY (Right) Principal Problem:  OA (osteoarthritis) of knee  Estimated body mass index is 32.23 kg/m as calculated from the following:   Height as of this encounter: 5\' 9"  (1.753 m).   Weight as of this encounter: 99 kg. Advance diet Up with therapy  Anticipated LOS equal to or greater than 2 midnights due to - Age 65 and older with one or more of the following:  - Obesity  - Expected need for hospital services (PT, OT, Nursing) required for safe  discharge  - Anticipated need for postoperative skilled nursing care or inpatient rehab  - Active co-morbidities: Coronary Artery Disease and DVT/VTE OR   - Unanticipated findings during/Post Surgery: None  - Patient is a high risk of re-admission due to: None    DVT Prophylaxis - Xarelto Weight bearing as tolerated. D/C O2 and pulse ox and try on room air. Hemovac pulled without difficulty, will begin therapy today.  Plan is to go Home after hospital stay. Dressing with moderate amount of bloody drainage this AM, new dressing applied. Hgb stable at 14.5. Plan for discharge tomorrow.  Theresa Duty, PA-C Orthopedic Surgery 08/06/2018, 7:59 AM

## 2018-08-06 NOTE — Care Management Note (Signed)
Case Management Note  Patient Details  Name: Marcus Greer MRN: [id number] Date of Birth: 12/15/1950  Subjective/Objective:   Spoke with patient at bedside. Confirmed plan for OP PT, already arranged. Has RW and 3n1. [phone]                 Action/Plan:   Expected Discharge Date:  08/06/18               Expected Discharge Plan:  OP Rehab  In-House Referral:  NA  Discharge planning Services  CM Consult  Post Acute Care Choice:  NA Choice offered to:  Patient  DME Arranged:  N/A DME Agency:  NA  HH Arranged:  NA HH Agency:  NA  Status of Service:  Completed, signed off  If discussed at [place] of Stay Meetings, dates discussed:    Additional Comments:  Guadalupe Maple, RN 08/06/2018, 11:52 AM

## 2018-08-06 NOTE — Progress Notes (Signed)
Physical Therapy Treatment Patient Details Name: Marcus Greer MRN: [id number] DOB: Apr 16, 1951 Today's Date: 08/06/2018    History of Present Illness Pt is a 67 year old male s/p R TKA on 08/05/18    PT Comments    Pt ambulated in hallway and performed LE exercises again upon return to supine.  Pt anticipates d/c tomorrow.  Follow Up Recommendations  Follow surgeon's recommendation for DC plan and follow-up therapies     Equipment Recommendations  None recommended by PT    Recommendations for Other Services       Precautions / Restrictions Precautions Precautions: Fall;Knee Required Braces or Orthoses: Knee Immobilizer - Right Restrictions Other Position/Activity Restrictions: WBAT    Mobility  Bed Mobility Overal bed mobility: Needs Assistance Bed Mobility: Supine to Sit;Sit to Supine     Supine to sit: Supervision Sit to supine: Supervision      Transfers Overall transfer level: Needs assistance Equipment used: Rolling walker (2 wheeled) Transfers: Sit to/from Stand Sit to Stand: Min guard         General transfer comment: verbal cues for UE and LE positioning  Ambulation/Gait Ambulation/Gait assistance: Min guard Gait Distance (Feet): 180 Feet Assistive device: Rolling walker (2 wheeled) Gait Pattern/deviations: Step-to pattern;Decreased stance time - right;Antalgic;Step-through pattern     General Gait Details: verbal cues for sequence, RW positioning, step length, posture, improved to step through pattern   Stairs             Wheelchair Mobility    Modified Rankin (Stroke Patients Only)       Balance                                            Cognition Arousal/Alertness: Awake/alert Behavior During Therapy: WFL for tasks assessed/performed Overall Cognitive Status: Within Functional Limits for tasks assessed                                        Exercises Total Joint Exercises Ankle  Circles/Pumps: AROM;10 reps;Both Quad Sets: AROM;10 reps;Both Short Arc Quad: AAROM;10 reps;Right Heel Slides: 10 reps;Right;AROM Hip ABduction/ADduction: Right;10 reps;AROM Straight Leg Raises: Right;10 reps;AROM    General Comments        Pertinent Vitals/Pain Pain Assessment: 0-10 Pain Score: 2  Pain Location: R knee Pain Descriptors / Indicators: Aching;Sore Pain Intervention(s): Limited activity within patient's tolerance;Repositioned;Monitored during session    Home Living                      Prior Function            PT Goals (current goals can now be found in the care plan section) Progress towards PT goals: Progressing toward goals    Frequency    7X/week      PT Plan Current plan remains appropriate    Co-evaluation              AM-PAC PT "6 Clicks" Mobility   Outcome Measure  Help needed turning from your back to your side while in a flat bed without using bedrails?: A Little Help needed moving from lying on your back to sitting on the side of a flat bed without using bedrails?: A Little Help needed moving to and from a bed to a chair (  including a wheelchair)?: A Little Help needed standing up from a chair using your arms (e.g., wheelchair or bedside chair)?: A Little Help needed to walk in hospital room?: A Little Help needed climbing 3-5 steps with a railing? : A Little 6 Click Score: 18    End of Session Equipment Utilized During Treatment: Right knee immobilizer Activity Tolerance: Patient tolerated treatment well Patient left: in bed;with call bell/phone within reach   PT Visit Diagnosis: Other abnormalities of gait and mobility (R26.89)     Time: 1437-1500 PT Time Calculation (min) (ACUTE ONLY): 23 min  Charges:  $Gait Training: 8-22 mins $Therapeutic Exercise: 8-22 mins                     Carmelia Bake, PT, DPT Acute Rehabilitation Services Office: [phone] Pager: [phone]  Trena Platt 08/06/2018,  4:17 PM

## 2018-08-06 NOTE — Evaluation (Signed)
Physical Therapy Evaluation Patient Details Name: Marcus Greer MRN: [id number] DOB: 1951-06-17 Today's Date: 08/06/2018   History of Present Illness  Pt is a 67 year old male s/p R TKA on 08/05/18  Clinical Impression  Pt is s/p TKA resulting in the deficits listed below (see PT Problem List).  Pt will benefit from skilled PT to increase their independence and safety with mobility to allow discharge to the venue listed below.  Pt assisted with ambulating in hallway and performing LE exercises POD #1.  Pt plans to d/c home and has a few steps to enter home.     Follow Up Recommendations Follow surgeon's recommendation for DC plan and follow-up therapies    Equipment Recommendations  None recommended by PT    Recommendations for Other Services       Precautions / Restrictions Precautions Precautions: Fall;Knee Required Braces or Orthoses: Knee Immobilizer - Right Restrictions Weight Bearing Restrictions: No Other Position/Activity Restrictions: WBAT      Mobility  Bed Mobility Overal bed mobility: Needs Assistance Bed Mobility: Supine to Sit;Sit to Supine     Supine to sit: Min guard Sit to supine: Min guard   General bed mobility comments: verbal cues for self assist  Transfers Overall transfer level: Needs assistance Equipment used: Rolling walker (2 wheeled) Transfers: Sit to/from Stand Sit to Stand: Min guard         General transfer comment: verbal cues for UE and LE positioning  Ambulation/Gait Ambulation/Gait assistance: Min guard Gait Distance (Feet): 120 Feet Assistive device: Rolling walker (2 wheeled) Gait Pattern/deviations: Step-to pattern;Decreased stance time - right;Antalgic     General Gait Details: verbal cues for sequence, RW positioning, step length, posture  Stairs            Wheelchair Mobility    Modified Rankin (Stroke Patients Only)       Balance                                              Pertinent Vitals/Pain Pain Assessment: 0-10 Pain Score: 3  Pain Location: R knee Pain Descriptors / Indicators: Aching;Sore Pain Intervention(s): Limited activity within patient's tolerance;Repositioned;Monitored during session;Premedicated before session    Home Living Family/patient expects to be discharged to:: Private residence Living Arrangements: Spouse/significant other   Type of Home: House Home Access: Stairs to enter Entrance Stairs-Rails: Psychiatric nurse of Steps: 6 Home Layout: One level Home Equipment: Environmental consultant - 2 wheels;Crutches      Prior Function Level of Independence: Independent               Hand Dominance        Extremity/Trunk Assessment        Lower Extremity Assessment Lower Extremity Assessment: RLE deficits/detail RLE Deficits / Details: unable to perform SLR, able to perform ankle pumps, R knee AAROM approx 80* supine       Communication   Communication: No difficulties  Cognition Arousal/Alertness: Awake/alert Behavior During Therapy: WFL for tasks assessed/performed Overall Cognitive Status: Within Functional Limits for tasks assessed                                        General Comments      Exercises Total Joint Exercises Ankle Circles/Pumps: AROM;10 reps;Both Quad Sets:  AROM;10 reps;Both Short Arc Quad: AAROM;10 reps;Right Heel Slides: AAROM;10 reps;Right Hip ABduction/ADduction: Right;AAROM;10 reps Straight Leg Raises: Right;AAROM;10 reps   Assessment/Plan    PT Assessment Patient needs continued PT services  PT Problem List Decreased strength;Decreased mobility;Decreased range of motion;Decreased knowledge of use of DME;Pain;Decreased knowledge of precautions       PT Treatment Interventions Stair training;Functional mobility training;Therapeutic exercise;Patient/family education;Gait training;Therapeutic activities;DME instruction    PT Goals (Current goals can be found  in the Care Plan section)  Acute Rehab PT Goals PT Goal Formulation: With patient Time For Goal Achievement: 08/10/18 Potential to Achieve Goals: Good    Frequency 7X/week   Barriers to discharge        Co-evaluation               AM-PAC PT "6 Clicks" Mobility  Outcome Measure Help needed turning from your back to your side while in a flat bed without using bedrails?: A Little Help needed moving from lying on your back to sitting on the side of a flat bed without using bedrails?: A Little Help needed moving to and from a bed to a chair (including a wheelchair)?: A Little Help needed standing up from a chair using your arms (e.g., wheelchair or bedside chair)?: A Little Help needed to walk in hospital room?: A Little Help needed climbing 3-5 steps with a railing? : A Lot 6 Click Score: 17    End of Session Equipment Utilized During Treatment: Gait belt;Right knee immobilizer Activity Tolerance: Patient tolerated treatment well Patient left: in bed;with call bell/phone within reach   PT Visit Diagnosis: Other abnormalities of gait and mobility (R26.89)    Time: 4158-3094 PT Time Calculation (min) (ACUTE ONLY): 28 min   Charges:   PT Evaluation $PT Eval Low Complexity: 1 Low PT Treatments $Therapeutic Exercise: 8-22 mins       Carmelia Bake, PT, DPT Acute Rehabilitation Services Office: [phone] Pager: [phone]  Trena Platt 08/06/2018, 11:41 AM

## 2018-08-07 DIAGNOSIS — K567 Ileus, unspecified: Secondary | ICD-10-CM | POA: Diagnosis not present

## 2018-08-07 LAB — BASIC METABOLIC PANEL
ANION GAP: 5 (ref 5–15)
BUN: 21 mg/dL (ref 8–23)
CHLORIDE: 105 mmol/L (ref 98–111)
CO2: 28 mmol/L (ref 22–32)
Calcium: 8.5 mg/dL — ABNORMAL LOW (ref 8.9–10.3)
Creatinine, Ser: 1.13 mg/dL (ref 0.61–1.24)
GFR calc non Af Amer: >60 mL/min (ref >60)
Glucose, Bld: 122 mg/dL — ABNORMAL HIGH (ref 70–99)
POTASSIUM: 4.4 mmol/L (ref 3.5–5.1)
SODIUM: 138 mmol/L (ref 135–145)

## 2018-08-07 LAB — CBC
HCT: 41.6 % (ref 39.0–52.0)
HEMOGLOBIN: 13.7 g/dL (ref 13.0–17.0)
MCH: 31.5 pg (ref 26.0–34.0)
MCHC: 32.9 g/dL (ref 30.0–36.0)
MCV: 95.6 fL (ref 80.0–100.0)
NRBC: 0 % (ref 0.0–0.2)
Platelets: 183 10*3/uL (ref 150–400)
RBC: 4.35 MIL/uL (ref 4.22–5.81)
RDW: 12.4 % (ref 11.5–15.5)
WBC: 21.7 10*3/uL — ABNORMAL HIGH (ref 4.0–10.5)

## 2018-08-07 MED ORDER — TRAMADOL HCL 50 MG PO TABS: 50.0000 mg | ORAL_TABLET | Freq: Four times a day (QID) | ORAL | Status: DC | PRN

## 2018-08-07 NOTE — Plan of Care (Signed)
Pt is stable. Plan of care reviewed. Pain management in progress.

## 2018-08-07 NOTE — Progress Notes (Signed)
Physical Therapy Treatment Patient Details Name: Marcus Greer MRN: [id number] DOB: 1950-12-23 Today's Date: 08/07/2018    History of Present Illness Pt is a 67 year old male s/p R TKA on 08/05/18    PT Comments    Pt ambulated in hallway and practiced safe stair technique.  Pt also performed LE exercises and provided with HEP handout.  Pt to d/c home today and had no further questions.   Follow Up Recommendations  Follow surgeon's recommendation for DC plan and follow-up therapies     Equipment Recommendations  None recommended by PT    Recommendations for Other Services       Precautions / Restrictions Precautions Precautions: Fall;Knee Required Braces or Orthoses: Knee Immobilizer - Right Restrictions Other Position/Activity Restrictions: WBAT    Mobility  Bed Mobility Overal bed mobility: Needs Assistance Bed Mobility: Supine to Sit;Sit to Supine     Supine to sit: Supervision Sit to supine: Supervision      Transfers Overall transfer level: Needs assistance Equipment used: Rolling walker (2 wheeled) Transfers: Sit to/from Stand Sit to Stand: Min guard         General transfer comment: verbal cues for UE and LE positioning  Ambulation/Gait Ambulation/Gait assistance: Min guard Gait Distance (Feet): 40 Feet Assistive device: Rolling walker (2 wheeled) Gait Pattern/deviations: Step-to pattern;Decreased stance time - right;Antalgic     General Gait Details: verbal cues for sequence, RW positioning, step length, posture, distance limited due to increased soreness and pain today   Stairs Stairs: Yes Stairs assistance: Min guard Stair Management: Step to pattern;Forwards;Two rails Number of Stairs: 2 General stair comments: verbal cues for sequence, safety; pt reports family can assist and he has crutch and cane at home to help as well   Wheelchair Mobility    Modified Rankin (Stroke Patients Only)       Balance                                             Cognition Arousal/Alertness: Awake/alert Behavior During Therapy: WFL for tasks assessed/performed Overall Cognitive Status: Within Functional Limits for tasks assessed                                        Exercises Total Joint Exercises Ankle Circles/Pumps: AROM;10 reps;Both Quad Sets: AROM;10 reps;Both Short Arc Quad: AAROM;10 reps;Right Heel Slides: 10 reps;Right;AAROM Hip ABduction/ADduction: Right;10 reps;AROM Straight Leg Raises: Right;10 reps;AAROM    General Comments        Pertinent Vitals/Pain Pain Assessment: 0-10 Pain Score: 5  Pain Location: R knee Pain Descriptors / Indicators: Aching;Sore Pain Intervention(s): Limited activity within patient's tolerance;Repositioned;Monitored during session    Home Living                      Prior Function            PT Goals (current goals can now be found in the care plan section) Progress towards PT goals: Progressing toward goals    Frequency    7X/week      PT Plan Current plan remains appropriate    Co-evaluation              AM-PAC PT "6 Clicks" Mobility   Outcome Measure  Help needed turning from  your back to your side while in a flat bed without using bedrails?: A Little Help needed moving from lying on your back to sitting on the side of a flat bed without using bedrails?: A Little Help needed moving to and from a bed to a chair (including a wheelchair)?: A Little Help needed standing up from a chair using your arms (e.g., wheelchair or bedside chair)?: A Little Help needed to walk in hospital room?: A Little Help needed climbing 3-5 steps with a railing? : A Little 6 Click Score: 18    End of Session Equipment Utilized During Treatment: Right knee immobilizer Activity Tolerance: Patient tolerated treatment well Patient left: with call bell/phone within reach;in chair Nurse Communication: Mobility status PT Visit Diagnosis:  Other abnormalities of gait and mobility (R26.89)     Time: 2820-6015 PT Time Calculation (min) (ACUTE ONLY): 23 min  Charges:  $Gait Training: 8-22 mins $Therapeutic Exercise: 8-22 mins                     Carmelia Bake, PT, DPT Acute Rehabilitation Services Office: [phone] Pager: [phone]  Trena Platt 08/07/2018, 3:10 PM

## 2018-08-07 NOTE — Progress Notes (Signed)
   Subjective: 2 Days Post-Op Procedure(s) (LRB): RIGHT TOTAL KNEE ARTHROPLASTY (Right) Patient reports pain as mild.   Patient seen in rounds by Dr. Wynelle Link. Patient is well, and has had no acute complaints or problems. States he is ready to go home. Denies chest pain, SOB, or calf pain. Voiding without difficulty and positive flatus.  Plan is to go Home after hospital stay.  Objective: Vital signs in last 24 hours: Temp:  [97.7 F (36.5 C)-98.3 F (36.8 C)] 97.7 F (36.5 C) (11/27 0455) Pulse Rate:  [60-72] 64 (11/27 0455) Resp:  [16-18] 16 (11/27 0455) BP: (126-138)/(68-80) 129/79 (11/27 0455) SpO2:  [96 %-99 %] 99 % (11/27 0455)  Intake/Output from previous day:  Intake/Output Summary (Last 24 hours) at 08/07/2018 0731 Last data filed at 08/07/2018 0523 Gross per 24 hour  Intake 1375.03 ml  Output 2850 ml  Net -1474.97 ml    Labs: Recent Labs    08/06/18 0419 08/07/18 0517  HGB 14.5 13.7   Recent Labs    08/06/18 0419 08/07/18 0517  WBC 18.6* 21.7*  RBC 4.65 4.35  HCT 44.3 41.6  PLT 198 183   Recent Labs    08/06/18 0419 08/07/18 0517  NA 138 138  K 4.6 4.4  CL 106 105  CO2 21* 28  BUN 18 21  CREATININE 1.25* 1.13  GLUCOSE 137* 122*  CALCIUM 8.7* 8.5*   Exam: General - Patient is Alert and Oriented Extremity - Neurologically intact Neurovascular intact Sensation intact distally Dorsiflexion/Plantar flexion intact Dressing/Incision - clean, dry, no drainage Motor Function - intact, moving foot and toes well on exam.   Past Medical History:  Diagnosis Date  . ASCVD (arteriosclerotic cardiovascular disease)   . Cancer (HCC)    basal cell nose  . Coronary artery disease    s/p PCI of the prox RCA with residual 40-50% distal RCA 11/2002  . DJD (degenerative joint disease)   . GERD (gastroesophageal reflux disease)   . Hyperlipidemia     Assessment/Plan: 2 Days Post-Op Procedure(s) (LRB): RIGHT TOTAL KNEE ARTHROPLASTY (Right) Principal  Problem:   OA (osteoarthritis) of knee  Estimated body mass index is 32.23 kg/m as calculated from the following:   Height as of this encounter: 5\' 9"  (1.753 m).   Weight as of this encounter: 99 kg. Up with therapy D/C IV fluids  DVT Prophylaxis - Xarelto Weight-bearing as tolerated  Plan for discharge to home today as long as meeting goals with therapy. Scheduled for OP PT at [facility]. Follow-up in the office in 2 weeks with Dr. Wynelle Link.  Theresa Duty, PA-C Orthopedic Surgery 08/07/2018, 7:31 AM

## 2018-08-10 ENCOUNTER — Emergency Department (HOSPITAL_BASED_OUTPATIENT_CLINIC_OR_DEPARTMENT_OTHER): Payer: 59

## 2018-08-10 ENCOUNTER — Inpatient Hospital Stay (HOSPITAL_BASED_OUTPATIENT_CLINIC_OR_DEPARTMENT_OTHER)
Admission: EM | Admit: 2018-08-10 | Discharge: 2018-08-14 | DRG: 389 | Disposition: A | Discharge status: Home / Self Care | Payer: 59 | Attending: Internal Medicine | Admitting: Internal Medicine

## 2018-08-10 ENCOUNTER — Other Ambulatory Visit: Payer: Self-pay

## 2018-08-10 ENCOUNTER — Encounter (HOSPITAL_BASED_OUTPATIENT_CLINIC_OR_DEPARTMENT_OTHER): Payer: Self-pay | Admitting: Emergency Medicine

## 2018-08-10 DIAGNOSIS — K760 Fatty (change of) liver, not elsewhere classified: Secondary | ICD-10-CM | POA: Diagnosis not present

## 2018-08-10 DIAGNOSIS — E876 Hypokalemia: Secondary | ICD-10-CM | POA: Diagnosis present

## 2018-08-10 DIAGNOSIS — K56609 Unspecified intestinal obstruction, unspecified as to partial versus complete obstruction: Secondary | ICD-10-CM

## 2018-08-10 DIAGNOSIS — I10 Essential (primary) hypertension: Secondary | ICD-10-CM | POA: Diagnosis not present

## 2018-08-10 DIAGNOSIS — K567 Ileus, unspecified: Secondary | ICD-10-CM | POA: Diagnosis not present

## 2018-08-10 DIAGNOSIS — T40605A Adverse effect of unspecified narcotics, initial encounter: Secondary | ICD-10-CM | POA: Diagnosis not present

## 2018-08-10 DIAGNOSIS — M7989 Other specified soft tissue disorders: Secondary | ICD-10-CM | POA: Diagnosis not present

## 2018-08-10 DIAGNOSIS — I2510 Atherosclerotic heart disease of native coronary artery without angina pectoris: Secondary | ICD-10-CM | POA: Diagnosis not present

## 2018-08-10 DIAGNOSIS — Z85828 Personal history of other malignant neoplasm of skin: Secondary | ICD-10-CM | POA: Diagnosis not present

## 2018-08-10 DIAGNOSIS — Z820 Family history of epilepsy and other diseases of the nervous system: Secondary | ICD-10-CM | POA: Diagnosis not present

## 2018-08-10 DIAGNOSIS — Z79899 Other long term (current) drug therapy: Secondary | ICD-10-CM | POA: Diagnosis not present

## 2018-08-10 DIAGNOSIS — Z96651 Presence of right artificial knee joint: Secondary | ICD-10-CM | POA: Diagnosis present

## 2018-08-10 DIAGNOSIS — Z91048 Other nonmedicinal substance allergy status: Secondary | ICD-10-CM | POA: Diagnosis not present

## 2018-08-10 DIAGNOSIS — K9189 Other postprocedural complications and disorders of digestive system: Secondary | ICD-10-CM

## 2018-08-10 DIAGNOSIS — E871 Hypo-osmolality and hyponatremia: Secondary | ICD-10-CM | POA: Diagnosis not present

## 2018-08-10 DIAGNOSIS — Z833 Family history of diabetes mellitus: Secondary | ICD-10-CM

## 2018-08-10 DIAGNOSIS — R52 Pain, unspecified: Secondary | ICD-10-CM | POA: Diagnosis not present

## 2018-08-10 DIAGNOSIS — K6389 Other specified diseases of intestine: Secondary | ICD-10-CM | POA: Diagnosis not present

## 2018-08-10 DIAGNOSIS — Z955 Presence of coronary angioplasty implant and graft: Secondary | ICD-10-CM

## 2018-08-10 DIAGNOSIS — R140 Abdominal distension (gaseous): Secondary | ICD-10-CM | POA: Diagnosis not present

## 2018-08-10 DIAGNOSIS — E785 Hyperlipidemia, unspecified: Secondary | ICD-10-CM | POA: Diagnosis present

## 2018-08-10 DIAGNOSIS — K219 Gastro-esophageal reflux disease without esophagitis: Secondary | ICD-10-CM | POA: Diagnosis present

## 2018-08-10 DIAGNOSIS — Z79891 Long term (current) use of opiate analgesic: Secondary | ICD-10-CM

## 2018-08-10 DIAGNOSIS — X58XXXA Exposure to other specified factors, initial encounter: Secondary | ICD-10-CM | POA: Diagnosis present

## 2018-08-10 DIAGNOSIS — Z888 Allergy status to other drugs, medicaments and biological substances status: Secondary | ICD-10-CM

## 2018-08-10 DIAGNOSIS — M1990 Unspecified osteoarthritis, unspecified site: Secondary | ICD-10-CM | POA: Diagnosis present

## 2018-08-10 LAB — COMPREHENSIVE METABOLIC PANEL
ALT: 34 U/L (ref 0–44)
AST: 34 U/L (ref 15–41)
Albumin: 3.2 g/dL — ABNORMAL LOW (ref 3.5–5.0)
Alkaline Phosphatase: 43 U/L (ref 38–126)
Anion gap: 9 (ref 5–15)
BILIRUBIN TOTAL: 1.7 mg/dL — AB (ref 0.3–1.2)
BUN: 19 mg/dL (ref 8–23)
CALCIUM: 8.1 mg/dL — AB (ref 8.9–10.3)
CHLORIDE: 98 mmol/L (ref 98–111)
CO2: 23 mmol/L (ref 22–32)
Creatinine, Ser: 0.9 mg/dL (ref 0.61–1.24)
Glucose, Bld: 120 mg/dL — ABNORMAL HIGH (ref 70–99)
Potassium: 3.5 mmol/L (ref 3.5–5.1)
Sodium: 130 mmol/L — ABNORMAL LOW (ref 135–145)
TOTAL PROTEIN: 6.5 g/dL (ref 6.5–8.1)

## 2018-08-10 LAB — CBC WITH DIFFERENTIAL/PLATELET
Abs Immature Granulocytes: 0.09 10*3/uL — ABNORMAL HIGH (ref 0.00–0.07)
Basophils Absolute: 0 10*3/uL (ref 0.0–0.1)
Basophils Relative: 0 %
EOS ABS: 0.1 10*3/uL (ref 0.0–0.5)
EOS PCT: 1 %
HCT: 36.6 % — ABNORMAL LOW (ref 39.0–52.0)
Hemoglobin: 11.9 g/dL — ABNORMAL LOW (ref 13.0–17.0)
Immature Granulocytes: 1 %
Lymphocytes Relative: 7 %
Lymphs Abs: 0.9 10*3/uL (ref 0.7–4.0)
MCH: 30.5 pg (ref 26.0–34.0)
MCHC: 32.5 g/dL (ref 30.0–36.0)
MCV: 93.8 fL (ref 80.0–100.0)
MONO ABS: 1.6 10*3/uL — AB (ref 0.1–1.0)
MONOS PCT: 14 %
Neutro Abs: 9.3 10*3/uL — ABNORMAL HIGH (ref 1.7–7.7)
Neutrophils Relative %: 77 %
Platelets: 222 10*3/uL (ref 150–400)
RBC: 3.9 MIL/uL — AB (ref 4.22–5.81)
RDW: 11.9 % (ref 11.5–15.5)
WBC: 12 10*3/uL — ABNORMAL HIGH (ref 4.0–10.5)
nRBC: 0 % (ref 0.0–0.2)

## 2018-08-10 LAB — LIPASE, BLOOD: LIPASE: 33 U/L (ref 11–51)

## 2018-08-10 MED ORDER — KETOROLAC TROMETHAMINE 30 MG/ML IJ SOLN
15.0000 mg | Freq: Three times a day (TID) | INTRAMUSCULAR | Status: DC
  Administered 2018-08-11 (×4): 15 mg via INTRAVENOUS
  Filled 2018-08-10 (×7): qty 1

## 2018-08-10 MED ORDER — ACETAMINOPHEN 500 MG PO TABS
1000.0000 mg | ORAL_TABLET | Freq: Three times a day (TID) | ORAL | Status: DC
  Administered 2018-08-11 – 2018-08-14 (×12): 1000 mg via ORAL
  Filled 2018-08-10 (×12): qty 2

## 2018-08-10 MED ORDER — ENOXAPARIN SODIUM 40 MG/0.4ML ~~LOC~~ SOLN
40.0000 mg | Freq: Every day | SUBCUTANEOUS | Status: DC
  Administered 2018-08-11 – 2018-08-12 (×2): 40 mg via SUBCUTANEOUS
  Filled 2018-08-10 (×2): qty 0.4

## 2018-08-10 MED ORDER — ONDANSETRON HCL 4 MG/2ML IJ SOLN
4.0000 mg | Freq: Once | INTRAMUSCULAR | Status: AC
  Administered 2018-08-10: 4 mg via INTRAVENOUS
  Filled 2018-08-10: qty 2

## 2018-08-10 MED ORDER — TRAMADOL HCL 50 MG PO TABS
50.0000 mg | ORAL_TABLET | Freq: Four times a day (QID) | ORAL | Status: DC | PRN
  Administered 2018-08-12 – 2018-08-14 (×9): 50 mg via ORAL
  Filled 2018-08-10 (×10): qty 1

## 2018-08-10 MED ORDER — SODIUM CHLORIDE 0.9 % IV BOLUS
500.0000 mL | Freq: Once | INTRAVENOUS | Status: AC
  Administered 2018-08-10: 500 mL via INTRAVENOUS

## 2018-08-10 MED ORDER — IOPAMIDOL (ISOVUE-300) INJECTION 61%: 100.0000 mL | Freq: Once | INTRAVENOUS | Status: DC | PRN

## 2018-08-10 MED ORDER — ONDANSETRON HCL 4 MG/2ML IJ SOLN: 4.0000 mg | Freq: Four times a day (QID) | INTRAMUSCULAR | Status: DC | PRN

## 2018-08-10 MED ORDER — SODIUM CHLORIDE 0.9 % IV SOLN
INTRAVENOUS | Status: DC
  Administered 2018-08-11 – 2018-08-12 (×4): via INTRAVENOUS

## 2018-08-10 NOTE — ED Notes (Signed)
Called [facility] to have hospitalist enter bed request.

## 2018-08-10 NOTE — H&P (Signed)
History and Physical    Marcus Greer:[id number] DOB: 1950/11/24 DOA: 08/10/2018  PCP: Marcus Cruel, MD  Patient coming from: home by way of [facility] ED   Chief Complaint: abdominal pain  HPI: Marcus Greer is a 67 y.o. male with medical history significant for CAD s/p PCI with stent in 2004, hypertension, and knee osteoarthritis s/p right TKA 08/05/2018, who presents with above.  States that essentially ever since surgery has had intermittent lower abdominal cramping particularly in the left lower quadrant with bloating and intermittent nausea and decreased passage of stool and flatus. Has had one bowel movement since surgery; has been passing small amounts of flatus and liquidy stool. Has transitioned oxycodone to tramadol. Is intermittently nauseaus and with decreased appetite, but no vomiting. Feels abdomen is very distended. No upper abdominal pain. No jaundice. No fevers.  ED Course: CT abdomen, labs  Review of Systems: As per HPI otherwise 10 point review of systems negative.    Past Medical History:  Diagnosis Date  . ASCVD (arteriosclerotic cardiovascular disease)   . Cancer (HCC)    basal cell nose  . Coronary artery disease    s/p PCI of the prox RCA with residual 40-50% distal RCA 11/2002  . DJD (degenerative joint disease)   . GERD (gastroesophageal reflux disease)   . Hyperlipidemia     Past Surgical History:  Procedure Laterality Date  . CARDIAC CATHETERIZATION     One cardiac stent 2002   . JOINT REPLACEMENT     right total knee Alsuisio 08-05-18  . KNEE ARTHROCENTESIS    . NOSE SURGERY    . SHOULDER ARTHROSCOPY     bone spur left  . TOTAL KNEE ARTHROPLASTY Right 08/05/2018   Procedure: RIGHT TOTAL KNEE ARTHROPLASTY;  Surgeon: Marcus Arabian, MD;  Location: [facility];  Service: Orthopedics;  Laterality: Right;  Adductor Block     reports that he has never smoked. He has never used smokeless tobacco. He reports that he drinks alcohol. He reports that  he does not use drugs.  Allergies  Allergen Reactions  . Gemfibrozil Other (See Comments)    Unknown  . Isovue [Iopamidol] Hives    Pt reports that he was told after having a heart catherization that he was allergic to the contrast used, states he had hives  . Niaspan [Niacin Er] Other (See Comments)    Unknown  . Tape Other (See Comments)    Irritation    Family History  Problem Relation Age of Onset  . Diabetes Father   . Epilepsy Sister     Prior to Admission medications   Medication Sig Start Date End Date Taking? Authorizing Provider  atorvastatin (LIPITOR) 20 MG tablet TAKE ONE TABLET BY MOUTH DAILY Patient taking differently: Take 20 mg by mouth at bedtime.  11/22/17   Marcus Margarita, MD  methocarbamol (ROBAXIN) 500 MG tablet Take 1 tablet (500 mg total) by mouth every 6 (six) hours as needed for muscle spasms. 08/06/18   Greer, Ok Anis, PA  nitroGLYCERIN (NITROSTAT) 0.4 MG SL tablet Place 1 tablet (0.4 mg total) under the tongue every 5 (five) minutes as needed for chest pain. Patient not taking: Reported on 07/24/2018 08/03/14   Marcus Margarita, MD  oxyCODONE (OXY IR/ROXICODONE) 5 MG immediate release tablet Take 1-2 tablets (5-10 mg total) by mouth every 6 (six) hours as needed for moderate pain (pain score 4-6). 08/06/18   Greer, Marcus L, PA  ramipril (ALTACE) 5 MG capsule TAKE  ONE CAPSULE BY MOUTH DAILY Patient taking differently: Take 5 mg by mouth daily.  11/22/17   Marcus Margarita, MD  rivaroxaban (XARELTO) 10 MG TABS tablet Take 1 tablet (10 mg total) by mouth daily with breakfast for 19 days. Then resume one 81 mg aspirin once a day. 08/07/18 08/26/18  Greer, Ok Anis, PA  traMADol (ULTRAM) 50 MG tablet Take 1-2 tablets (50-100 mg total) by mouth every 6 (six) hours as needed for moderate pain. 08/06/18   Derl Barrow, PA    Physical Exam: Vitals:   08/10/18 1411 08/10/18 1643 08/10/18 1845 08/10/18 2138  BP:  (!) 140/95 128/77 135/86  Pulse:   80 78 77  Resp:  18 16 20   Temp:    98.2 F (36.8 C)  TempSrc:    Oral  SpO2:  94% 94% 97%  Weight: 99 kg     Height: 5\' 9"  (1.753 m)       Constitutional: No acute distress Head: Atraumatic Eyes: Conjunctiva clear ENM: Moist mucous membranes. Normal dentition.  Neck: Supple Respiratory: Clear to auscultation bilaterally, no wheezing/rales/rhonchi. Normal respiratory effort. No accessory muscle use. . Cardiovascular: Regular rate and rhythm. No murmurs/rubs/gallops. Abdomen: distended, mild ttp diffuse left lower quadrant, absent bowel sounds b/l lower quadrants, decreased in upper quadrants. No rebound or guarding Musculoskeletal: bandage over right knee small amount of dried blood. That knee is swollen compared to the left knee  Skin: no ulcers  Extremities: No peripheral edema. Palpable peripheral pulses. Neurologic: Alert, moving all 4 extremities. Psychiatric: Normal insight and judgement.   Labs on Admission: I have personally reviewed following labs and imaging studies  CBC: Recent Labs  Lab 08/06/18 0419 08/07/18 0517 08/10/18 1528  WBC 18.6* 21.7* 12.0*  NEUTROABS  --   --  9.3*  HGB 14.5 13.7 11.9*  HCT 44.3 41.6 36.6*  MCV 95.3 95.6 93.8  PLT 198 183 562   Basic Metabolic Panel: Recent Labs  Lab 08/06/18 0419 08/07/18 0517 08/10/18 1528  NA 138 138 130*  K 4.6 4.4 3.5  CL 106 105 98  CO2 21* 28 23  GLUCOSE 137* 122* 120*  BUN 18 21 19   CREATININE 1.25* 1.13 0.90  CALCIUM 8.7* 8.5* 8.1*   GFR: Estimated Creatinine Clearance: 94.9 mL/min (by C-G formula based on SCr of 0.9 mg/dL). Liver Function Tests: Recent Labs  Lab 08/10/18 1528  AST 34  ALT 34  ALKPHOS 43  BILITOT 1.7*  PROT 6.5  ALBUMIN 3.2*   Recent Labs  Lab 08/10/18 1528  LIPASE 33   No results for input(s): AMMONIA in the last 168 hours. Coagulation Profile: No results for input(s): INR, PROTIME in the last 168 hours. Cardiac Enzymes: No results for input(s): CKTOTAL,  CKMB, CKMBINDEX, TROPONINI in the last 168 hours. BNP (last 3 results) No results for input(s): PROBNP in the last 8760 hours. HbA1C: No results for input(s): HGBA1C in the last 72 hours. CBG: No results for input(s): GLUCAP in the last 168 hours. Lipid Profile: No results for input(s): CHOL, HDL, LDLCALC, TRIG, CHOLHDL, LDLDIRECT in the last 72 hours. Thyroid Function Tests: No results for input(s): TSH, T4TOTAL, FREET4, T3FREE, THYROIDAB in the last 72 hours. Anemia Panel: No results for input(s): VITAMINB12, FOLATE, FERRITIN, TIBC, IRON, RETICCTPCT in the last 72 hours. Urine analysis: No results found for: COLORURINE, APPEARANCEUR, [place], [place], GLUCOSEU, HGBUR, BILIRUBINUR, KETONESUR, PROTEINUR, UROBILINOGEN, NITRITE, LEUKOCYTESUR  Radiological Exams on Admission: Ct Abdomen Pelvis Wo Contrast  Result Date: 08/10/2018 CLINICAL  DATA:  Abdominal pain. Recent surgery on 08/05/2018. Constipation, abdominal pain and bloating. EXAM: CT ABDOMEN AND PELVIS WITHOUT CONTRAST TECHNIQUE: Multidetector CT imaging of the abdomen and pelvis was performed following the standard protocol without IV contrast. COMPARISON:  None. FINDINGS: Lower chest: No acute abnormality. Hepatobiliary: Hepatic steatosis noted. No focal liver abnormality identified. Gallbladder sludge layering within the dependent portion of the gallbladder noted. No gallbladder wall thickening, pericholecystic fluid or biliary dilatation. Pancreas: Unremarkable. No pancreatic ductal dilatation or surrounding inflammatory changes. Spleen: Normal in size without focal abnormality. Adrenals/Urinary Tract: Normal adrenal glands. Bilateral kidney cysts are identified. These are incompletely characterized without IV contrast. The largest arises from the upper pole of right kidney measuring 9.3 cm. No kidney stones or hydronephrosis identified. No ureterolithiasis or bladder calculi. Stomach/Bowel: Stomach appears normal. The small bowel loops  have a normal caliber. The appendix is visualized and appears normal. There is diffuse gaseous distension of the colon all the way to the level of the rectum. No mass identified. Vascular/Lymphatic: Normal appearance of the abdominal aorta. No enlarged lymph nodes within the abdomen or pelvis. No inguinal adenopathy. Reproductive: Prostate is unremarkable. Other: No free fluid or fluid collections. Musculoskeletal: There is asymmetric edema and subcutaneous fat stranding overlying the right hip which may be postoperative. No acute or significant osseous abnormalities identified. IMPRESSION: 1. There is diffuse gaseous distension of the colon up to the level of the rectum. Favor postoperative ileus over distal bowel obstruction. 2. Hepatic steatosis. 3. Bilateral kidney cysts, incompletely characterized without IV contrast 4.  Aortic Atherosclerosis (ICD10-I70.0). Electronically Signed   By: Kerby Moors M.D.   On: 08/10/2018 17:30   Dg Abd Acute W/chest  Result Date: 08/10/2018 CLINICAL DATA:  Abdominal pain and distension 3-4 days. EXAM: DG ABDOMEN ACUTE W/ 1V CHEST COMPARISON:  None. FINDINGS: Lungs are adequately inflated and otherwise clear. Cardiomediastinal silhouette and remainder of the chest is unremarkable. Abdominopelvic images demonstrate multiple air-filled colonic loops with prominent colonic loop over the midline abdomen on the supine film measuring 13.1 cm which may represent dilated sigmoid colon. Possible dilatation of a small bowel loop over the right mid to upper abdomen measuring 6.1 cm in diameter. There is no free peritoneal air. Mild degenerate change of the spine. IMPRESSION: Dilated air-filled colonic loop over the midline mid to lower abdomen likely sigmoid colon. Sigmoid volvulus is possible. Possible single dilated small bowel loop over the right mid to upper abdomen. Consider abdominopelvic CT scan for further evaluation. No acute cardiopulmonary disease. Electronically Signed    By: Marin Olp M.D.   On: 08/10/2018 16:47     Assessment/Plan Active Problems:   Coronary artery disease   Postoperative ileus (HCC)   Hyponatremia   Essential hypertension   # Post-op ileus - symptoms and CT very suggestive. No obvious signs obstruction on CT. No other significant intraabdominal pathology seen on CT. Opioid use likely contributive. No vomiting so no urgent need for NT tube. - NPO - IV fluids - pain control as below, avoid opioids as able  # Right total knee arthroplasty - POD #5. Appears to be healing well - WBAT - lovenox for dvt ppx - scheduled tylenol and toradol; tramadol prn - bedside commode  # Hyponatremia - mild, likely siadh 2/2 recent surgery and pain - cmp in AM - NS fluids  # Hyperbilirubinemia - no sig hepatobiliary pathology seen on CT, LFTs wnl. H 11.9, appears appropriate after surgery. Imagine this associated w/ ileus. - repeat CMP in  AM  # htn # CAD - here bp mild elevation - hold home atorva and ramipril while npo  DVT prophylaxis: lovenox Code Status: full  Family Communication: wife  Disposition Plan: tbd  Consults called: none  Admission status: med/surg    Desma Maxim MD [facility] Pager [phone]  If 7PM-7AM, please contact night-coverage [email or web address] Password [facility]  08/10/2018, 10:42 PM

## 2018-08-10 NOTE — ED Triage Notes (Signed)
Patient states that he has knee surgery on Monday - he had a BM prior to going home from the hospital but has not had one since then. The patient reports that he is bloated and having abdominal pain and no appetite

## 2018-08-10 NOTE — ED Provider Notes (Signed)
[place] EMERGENCY DEPARTMENT Provider Note   CSN: [id number] Arrival date & time: 08/10/18  1404     History   Chief Complaint Chief Complaint  Patient presents with  . Abdominal Pain    HPI Marcus Greer is a 67 y.o. male.  Patient is a 67 year old male with a history of coronary artery disease, hyperlipidemia, GERD who presents with abdominal discomfort and distention.  He had knee surgery 6 days ago.  He states he has not had a bowel movement since then other than he took a stool softener 2 days ago and had some watery stool but no other bowel movement since that time.  He feels like his abdomen is distended.  He has some nausea but no vomiting.  He has some generalized abdominal discomfort.  He has a little bit of difficulty getting his urine out because of the abdominal swelling but otherwise no burning or urinary frequency.  No fevers.  He has been taking oxycodone for his knee.  Overall he says his knee is feeling better but now his abdomen is a source of discomfort.  He has no prior history of bowel obstructions or abdominal surgeries.     Past Medical History:  Diagnosis Date  . ASCVD (arteriosclerotic cardiovascular disease)   . Cancer (HCC)    basal cell nose  . Coronary artery disease    s/p PCI of the prox RCA with residual 40-50% distal RCA 11/2002  . DJD (degenerative joint disease)   . GERD (gastroesophageal reflux disease)   . Hyperlipidemia     Patient Active Problem List   Diagnosis Date Noted  . Postoperative ileus ([place]) 08/10/2018  . Hyponatremia 08/10/2018  . Essential hypertension 08/10/2018  . OA (osteoarthritis) of knee 08/05/2018  . Preoperative clearance 06/26/2018  . DOE (dyspnea on exertion) 10/04/2016  . Coronary artery disease   . Hyperlipidemia     Past Surgical History:  Procedure Laterality Date  . CARDIAC CATHETERIZATION     One cardiac stent 2002   . JOINT REPLACEMENT     right total knee Alsuisio 08-05-18  .  KNEE ARTHROCENTESIS    . NOSE SURGERY    . SHOULDER ARTHROSCOPY     bone spur left  . TOTAL KNEE ARTHROPLASTY Right 08/05/2018   Procedure: RIGHT TOTAL KNEE ARTHROPLASTY;  Surgeon: Gaynelle Arabian, MD;  Location: [facility];  Service: Orthopedics;  Laterality: Right;  Adductor Block        Home Medications    Prior to Admission medications   Medication Sig Start Date End Date Taking? Authorizing Provider  atorvastatin (LIPITOR) 20 MG tablet TAKE ONE TABLET BY MOUTH DAILY Patient taking differently: Take 20 mg by mouth at bedtime.  11/22/17   Sueanne Margarita, MD  methocarbamol (ROBAXIN) 500 MG tablet Take 1 tablet (500 mg total) by mouth every 6 (six) hours as needed for muscle spasms. 08/06/18   Edmisten, Ok Anis, PA  nitroGLYCERIN (NITROSTAT) 0.4 MG SL tablet Place 1 tablet (0.4 mg total) under the tongue every 5 (five) minutes as needed for chest pain. Patient not taking: Reported on 07/24/2018 08/03/14   Sueanne Margarita, MD  oxyCODONE (OXY IR/ROXICODONE) 5 MG immediate release tablet Take 1-2 tablets (5-10 mg total) by mouth every 6 (six) hours as needed for moderate pain (pain score 4-6). 08/06/18   Edmisten, Kristie L, PA  ramipril (ALTACE) 5 MG capsule TAKE ONE CAPSULE BY MOUTH DAILY Patient taking differently: Take 5 mg by mouth daily.  11/22/17  Sueanne Margarita, MD  rivaroxaban (XARELTO) 10 MG TABS tablet Take 1 tablet (10 mg total) by mouth daily with breakfast for 19 days. Then resume one 81 mg aspirin once a day. 08/07/18 08/26/18  Edmisten, Ok Anis, PA  traMADol (ULTRAM) 50 MG tablet Take 1-2 tablets (50-100 mg total) by mouth every 6 (six) hours as needed for moderate pain. 08/06/18   Edmisten, Ok Anis, PA    Family History Family History  Problem Relation Age of Onset  . Diabetes Father   . Epilepsy Sister     Social History Social History   Tobacco Use  . Smoking status: Never Smoker  . Smokeless tobacco: Never Used  Substance Use Topics  . Alcohol use: Yes     Comment: occsaional  . Drug use: Never     Allergies   Gemfibrozil; Isovue [iopamidol]; Niaspan [niacin er]; and Tape   Review of Systems Review of Systems  Constitutional: Negative for chills, diaphoresis, fatigue and fever.  HENT: Negative for congestion, rhinorrhea and sneezing.   Eyes: Negative.   Respiratory: Negative for cough, chest tightness and shortness of breath.   Cardiovascular: Negative for chest pain and leg swelling.  Gastrointestinal: Positive for abdominal distention, abdominal pain and nausea. Negative for blood in stool, diarrhea and vomiting.  Genitourinary: Negative for difficulty urinating, flank pain, frequency and hematuria.  Musculoskeletal: Positive for arthralgias. Negative for back pain.  Skin: Negative for rash.  Neurological: Negative for dizziness, speech difficulty, weakness, numbness and headaches.     Physical Exam Updated Vital Signs BP 135/86 (BP Location: Left Leg)   Pulse 77   Temp 98.2 F (36.8 C) (Oral)   Resp 20   Ht 5\' 9"  (1.753 m)   Wt 99 kg   SpO2 97%   BMI 32.23 kg/m   Physical Exam  Constitutional: He is oriented to person, place, and time. He appears well-developed and well-nourished.  HENT:  Head: Normocephalic and atraumatic.  Eyes: Pupils are equal, round, and reactive to light.  Neck: Normal range of motion. Neck supple.  Cardiovascular: Normal rate and regular rhythm.  Murmur heard. Faint murmur heard over the mitral valve area  Pulmonary/Chest: Effort normal and breath sounds normal. No respiratory distress. He has no wheezes. He has no rales. He exhibits no tenderness.  Abdominal: Soft. Bowel sounds are normal. He exhibits distension. There is generalized tenderness. There is no rebound and no guarding.  Tympanic on percussion  Musculoskeletal: Normal range of motion. He exhibits no edema.  Lymphadenopathy:    He has no cervical adenopathy.  Neurological: He is alert and oriented to person, place, and time.    Skin: Skin is warm and dry. No rash noted.  Psychiatric: He has a normal mood and affect.     ED Treatments / Results  Labs (all labs ordered are listed, but only abnormal results are displayed) Labs Reviewed  COMPREHENSIVE METABOLIC PANEL - Abnormal; Notable for the following components:      Result Value   Sodium 130 (*)    Glucose, Bld 120 (*)    Calcium 8.1 (*)    Albumin 3.2 (*)    Total Bilirubin 1.7 (*)    All other components within normal limits  CBC WITH DIFFERENTIAL/PLATELET - Abnormal; Notable for the following components:   WBC 12.0 (*)    RBC 3.90 (*)    Hemoglobin 11.9 (*)    HCT 36.6 (*)    Neutro Abs 9.3 (*)    Monocytes Absolute 1.6 (*)  Abs Immature Granulocytes 0.09 (*)    All other components within normal limits  LIPASE, BLOOD    EKG None  Radiology Ct Abdomen Pelvis Wo Contrast  Result Date: 08/10/2018 CLINICAL DATA:  Abdominal pain. Recent surgery on 08/05/2018. Constipation, abdominal pain and bloating. EXAM: CT ABDOMEN AND PELVIS WITHOUT CONTRAST TECHNIQUE: Multidetector CT imaging of the abdomen and pelvis was performed following the standard protocol without IV contrast. COMPARISON:  None. FINDINGS: Lower chest: No acute abnormality. Hepatobiliary: Hepatic steatosis noted. No focal liver abnormality identified. Gallbladder sludge layering within the dependent portion of the gallbladder noted. No gallbladder wall thickening, pericholecystic fluid or biliary dilatation. Pancreas: Unremarkable. No pancreatic ductal dilatation or surrounding inflammatory changes. Spleen: Normal in size without focal abnormality. Adrenals/Urinary Tract: Normal adrenal glands. Bilateral kidney cysts are identified. These are incompletely characterized without IV contrast. The largest arises from the upper pole of right kidney measuring 9.3 cm. No kidney stones or hydronephrosis identified. No ureterolithiasis or bladder calculi. Stomach/Bowel: Stomach appears normal. The  small bowel loops have a normal caliber. The appendix is visualized and appears normal. There is diffuse gaseous distension of the colon all the way to the level of the rectum. No mass identified. Vascular/Lymphatic: Normal appearance of the abdominal aorta. No enlarged lymph nodes within the abdomen or pelvis. No inguinal adenopathy. Reproductive: Prostate is unremarkable. Other: No free fluid or fluid collections. Musculoskeletal: There is asymmetric edema and subcutaneous fat stranding overlying the right hip which may be postoperative. No acute or significant osseous abnormalities identified. IMPRESSION: 1. There is diffuse gaseous distension of the colon up to the level of the rectum. Favor postoperative ileus over distal bowel obstruction. 2. Hepatic steatosis. 3. Bilateral kidney cysts, incompletely characterized without IV contrast 4.  Aortic Atherosclerosis (ICD10-I70.0). Electronically Signed   By: Kerby Moors M.D.   On: 08/10/2018 17:30   Dg Abd Acute W/chest  Result Date: 08/10/2018 CLINICAL DATA:  Abdominal pain and distension 3-4 days. EXAM: DG ABDOMEN ACUTE W/ 1V CHEST COMPARISON:  None. FINDINGS: Lungs are adequately inflated and otherwise clear. Cardiomediastinal silhouette and remainder of the chest is unremarkable. Abdominopelvic images demonstrate multiple air-filled colonic loops with prominent colonic loop over the midline abdomen on the supine film measuring 13.1 cm which may represent dilated sigmoid colon. Possible dilatation of a small bowel loop over the right mid to upper abdomen measuring 6.1 cm in diameter. There is no free peritoneal air. Mild degenerate change of the spine. IMPRESSION: Dilated air-filled colonic loop over the midline mid to lower abdomen likely sigmoid colon. Sigmoid volvulus is possible. Possible single dilated small bowel loop over the right mid to upper abdomen. Consider abdominopelvic CT scan for further evaluation. No acute cardiopulmonary disease.  Electronically Signed   By: Marin Olp M.D.   On: 08/10/2018 16:47    Procedures Procedures (including critical care time)  Medications Ordered in ED Medications  sodium chloride 0.9 % bolus 500 mL ( Intravenous Stopped 08/10/18 1918)  ondansetron (ZOFRAN) injection 4 mg (4 mg Intravenous Given 08/10/18 1818)     Initial Impression / Assessment and Plan / ED Course  I have reviewed the triage vital signs and the nursing notes.  Pertinent labs & imaging results that were available during my care of the patient were reviewed by me and considered in my medical decision making (see chart for details).     Patient presents with marked abdominal distention and nausea.  He has evidence of an ileus on CT scan.  His labs  show a slightly low sodium and mildly elevated WBC count.  He was given IV fluids.  I spoke with Dr. Si Raider with the hospitalist service and pt will be transferred for admission to [facility].  Final Clinical Impressions(s) / ED Diagnoses   Final diagnoses:  Ileus [facility])    ED Discharge Orders    None       Malvin Johns, MD 08/10/18 2334

## 2018-08-10 NOTE — ED Notes (Signed)
Report to [place], Therapist, sports at [employer].

## 2018-08-10 NOTE — ED Notes (Signed)
Pt sts he feels "crappy." He is having difficulty specifying sxs, but does report nausea. EDP made aware and orders received.

## 2018-08-11 LAB — COMPREHENSIVE METABOLIC PANEL
ALT: 30 U/L (ref 0–44)
AST: 27 U/L (ref 15–41)
Albumin: 3.1 g/dL — ABNORMAL LOW (ref 3.5–5.0)
Alkaline Phosphatase: 44 U/L (ref 38–126)
Anion gap: 6 (ref 5–15)
BUN: 19 mg/dL (ref 8–23)
CHLORIDE: 101 mmol/L (ref 98–111)
CO2: 28 mmol/L (ref 22–32)
Calcium: 8.1 mg/dL — ABNORMAL LOW (ref 8.9–10.3)
Creatinine, Ser: 0.92 mg/dL (ref 0.61–1.24)
GFR calc Af Amer: >60 mL/min (ref >60)
GFR calc non Af Amer: >60 mL/min (ref >60)
Glucose, Bld: 105 mg/dL — ABNORMAL HIGH (ref 70–99)
Potassium: 3.3 mmol/L — ABNORMAL LOW (ref 3.5–5.1)
SODIUM: 135 mmol/L (ref 135–145)
Total Bilirubin: 1.6 mg/dL — ABNORMAL HIGH (ref 0.3–1.2)
Total Protein: 6 g/dL — ABNORMAL LOW (ref 6.5–8.1)

## 2018-08-11 LAB — HIV ANTIBODY (ROUTINE TESTING W REFLEX): HIV Screen 4th Generation wRfx: NONREACTIVE

## 2018-08-11 MED ORDER — POTASSIUM CHLORIDE 10 MEQ/100ML IV SOLN
10.0000 meq | Freq: Once | INTRAVENOUS | Status: AC
  Administered 2018-08-11: 10 meq via INTRAVENOUS
  Filled 2018-08-11: qty 100

## 2018-08-11 MED ORDER — POLYETHYLENE GLYCOL 3350 17 G PO PACK
17.0000 g | PACK | Freq: Three times a day (TID) | ORAL | Status: DC
  Administered 2018-08-11 – 2018-08-12 (×4): 17 g via ORAL
  Filled 2018-08-11 (×4): qty 1

## 2018-08-11 MED ORDER — BISACODYL 10 MG RE SUPP
10.0000 mg | Freq: Every day | RECTAL | Status: DC | PRN
  Administered 2018-08-11: 10 mg via RECTAL
  Filled 2018-08-11 (×3): qty 1

## 2018-08-11 NOTE — Plan of Care (Signed)
Patient in bed this morning. Request help to get oob and to bathroom. Dressing changed to right knee; knee immobilizer placed and patient got oob with aid of walker. Will continue to monitor.

## 2018-08-11 NOTE — Progress Notes (Signed)
[facility]  PROGRESS NOTE  Marcus Greer NLZ:[id number] DOB: 1950-12-20 DOA: 08/10/2018 PCP: Lawerance Cruel, MD   Brief HPI:   67 year old male with a history of CAD status post PCI with stent in 2004, hypertension, osteoarthritis status post right total knee arthroplasty 08/05/2018 came with abdominal pain.  CT of the abdomen showed findings suggestive of ileus.  Patient not vomiting so no NG tube was inserted.    Subjective   This morning patient denies any pain.  No nausea vomiting.  Still feels bloated but has been passing gas.   Assessment/Plan:     1. Postop ileus-secondary to opiates, patient was discharged on opioid medications after he had total knee arthroplasty.  Opioids are currently on hold.  Will start MiraLAX 17 g 3 times daily, also start Dulcolax 10 mg per rectum as needed.  2. Status post right total knee arthroplasty-POD #5, patient is weightbearing as tolerated.  Continue Lovenox for DVT prophylaxis.  3. Hyponatremia-resolved, sodium is 135.  4. Hypokalemia-potassium 3.3, will replace potassium.  Check BMP in a.m  5. Hyperbilirubinemia-bilirubin mildly immature 1.6, improved from 1.7 yesterday.  AST ALT are within normal limits.  Unclear etiology.  No abnormality seen on CT scan.  Will continue to follow LFTs in the hospital.     CBC: Recent Labs  Lab 08/06/18 0419 08/07/18 0517 08/10/18 1528  WBC 18.6* 21.7* 12.0*  NEUTROABS  --   --  9.3*  HGB 14.5 13.7 11.9*  HCT 44.3 41.6 36.6*  MCV 95.3 95.6 93.8  PLT 198 183 902    Basic Metabolic Panel: Recent Labs  Lab 08/06/18 0419 08/07/18 0517 08/10/18 1528 08/11/18 0354  NA 138 138 130* 135  K 4.6 4.4 3.5 3.3*  CL 106 105 98 101  CO2 21* 28 23 28   GLUCOSE 137* 122* 120* 105*  BUN 18 21 19 19   CREATININE 1.25* 1.13 0.90 0.92  CALCIUM 8.7* 8.5* 8.1* 8.1*     DVT prophylaxis: Lovenox  Code Status: Full code  Family Communication: No family at bedside  Disposition Plan:  likely home when medically ready for discharge   Consultants:    Procedures:     Antibiotics:   Anti-infectives (From admission, onward)   None       Objective   Vitals:   08/10/18 1643 08/10/18 1845 08/10/18 2138 08/11/18 0454  BP: (!) 140/95 128/77 135/86 128/78  Pulse: 80 78 77 67  Resp: 18 16 20 16   Temp:   98.2 F (36.8 C) 98.3 F (36.8 C)  TempSrc:   Oral Oral  SpO2: 94% 94% 97% 98%  Weight:      Height:        Intake/Output Summary (Last 24 hours) at 08/11/2018 1224 Last data filed at 08/11/2018 1000 Gross per 24 hour  Intake 1657.46 ml  Output 1200 ml  Net 457.46 ml   Filed Weights   08/10/18 1411  Weight: 99 kg     Physical Examination:    General: Appears in no acute distress  Cardiovascular: S1-S2, regular, no murmur auscultated  Respiratory: Clear to auscultation bilaterally, no wheezing auscultated  Abdomen: Abdomen is soft, distended, nontender to palpation  Extremities: No edema of the lower extremities  Neurologic: Alert, oriented x3, no focal deficit noted     Data Reviewed: I have personally reviewed following labs and imaging studies   No results found for this or any previous visit (from the past 240 hour(s)).   Liver Function Tests: Recent Labs  Lab 08/10/18 1528 08/11/18 0354  AST 34 27  ALT 34 30  ALKPHOS 43 44  BILITOT 1.7* 1.6*  PROT 6.5 6.0*  ALBUMIN 3.2* 3.1*   Recent Labs  Lab 08/10/18 1528  LIPASE 33   No results for input(s): AMMONIA in the last 168 hours.  Cardiac Enzymes: No results for input(s): CKTOTAL, CKMB, CKMBINDEX, TROPONINI in the last 168 hours. BNP (last 3 results) No results for input(s): BNP in the last 8760 hours.  ProBNP (last 3 results) No results for input(s): PROBNP in the last 8760 hours.    Studies: Ct Abdomen Pelvis Wo Contrast  Result Date: 08/10/2018 CLINICAL DATA:  Abdominal pain. Recent surgery on 08/05/2018. Constipation, abdominal pain and bloating. EXAM:  CT ABDOMEN AND PELVIS WITHOUT CONTRAST TECHNIQUE: Multidetector CT imaging of the abdomen and pelvis was performed following the standard protocol without IV contrast. COMPARISON:  None. FINDINGS: Lower chest: No acute abnormality. Hepatobiliary: Hepatic steatosis noted. No focal liver abnormality identified. Gallbladder sludge layering within the dependent portion of the gallbladder noted. No gallbladder wall thickening, pericholecystic fluid or biliary dilatation. Pancreas: Unremarkable. No pancreatic ductal dilatation or surrounding inflammatory changes. Spleen: Normal in size without focal abnormality. Adrenals/Urinary Tract: Normal adrenal glands. Bilateral kidney cysts are identified. These are incompletely characterized without IV contrast. The largest arises from the upper pole of right kidney measuring 9.3 cm. No kidney stones or hydronephrosis identified. No ureterolithiasis or bladder calculi. Stomach/Bowel: Stomach appears normal. The small bowel loops have a normal caliber. The appendix is visualized and appears normal. There is diffuse gaseous distension of the colon all the way to the level of the rectum. No mass identified. Vascular/Lymphatic: Normal appearance of the abdominal aorta. No enlarged lymph nodes within the abdomen or pelvis. No inguinal adenopathy. Reproductive: Prostate is unremarkable. Other: No free fluid or fluid collections. Musculoskeletal: There is asymmetric edema and subcutaneous fat stranding overlying the right hip which may be postoperative. No acute or significant osseous abnormalities identified. IMPRESSION: 1. There is diffuse gaseous distension of the colon up to the level of the rectum. Favor postoperative ileus over distal bowel obstruction. 2. Hepatic steatosis. 3. Bilateral kidney cysts, incompletely characterized without IV contrast 4.  Aortic Atherosclerosis (ICD10-I70.0). Electronically Signed   By: Kerby Moors M.D.   On: 08/10/2018 17:30   Dg Abd Acute  W/chest  Result Date: 08/10/2018 CLINICAL DATA:  Abdominal pain and distension 3-4 days. EXAM: DG ABDOMEN ACUTE W/ 1V CHEST COMPARISON:  None. FINDINGS: Lungs are adequately inflated and otherwise clear. Cardiomediastinal silhouette and remainder of the chest is unremarkable. Abdominopelvic images demonstrate multiple air-filled colonic loops with prominent colonic loop over the midline abdomen on the supine film measuring 13.1 cm which may represent dilated sigmoid colon. Possible dilatation of a small bowel loop over the right mid to upper abdomen measuring 6.1 cm in diameter. There is no free peritoneal air. Mild degenerate change of the spine. IMPRESSION: Dilated air-filled colonic loop over the midline mid to lower abdomen likely sigmoid colon. Sigmoid volvulus is possible. Possible single dilated small bowel loop over the right mid to upper abdomen. Consider abdominopelvic CT scan for further evaluation. No acute cardiopulmonary disease. Electronically Signed   By: Marin Olp M.D.   On: 08/10/2018 16:47    Scheduled Meds: . acetaminophen  1,000 mg Oral Q8H  . enoxaparin (LOVENOX) injection  40 mg Subcutaneous Daily  . ketorolac  15 mg Intravenous Q8H  . polyethylene glycol  17 g Oral TID    Admission  status: Inpatient: Based on patients clinical presentation and evaluation of above clinical data, I have made determination that patient meets Inpatient criteria at this time.  Patient has postop ileus, requiring IV fluids.  Time spent: 25 min  [facility] Pager [phone]. If 7PM-7AM, please contact night-coverage at [email or web address], Office  [phone]  password [email or web address]  08/11/2018, 12:24 PM  LOS: 1 day

## 2018-08-12 ENCOUNTER — Inpatient Hospital Stay (HOSPITAL_COMMUNITY): Payer: 59

## 2018-08-12 LAB — COMPREHENSIVE METABOLIC PANEL
ALT: 33 U/L (ref 0–44)
AST: 28 U/L (ref 15–41)
Albumin: 2.8 g/dL — ABNORMAL LOW (ref 3.5–5.0)
Alkaline Phosphatase: 44 U/L (ref 38–126)
Anion gap: 8 (ref 5–15)
BUN: 16 mg/dL (ref 8–23)
CHLORIDE: 106 mmol/L (ref 98–111)
CO2: 26 mmol/L (ref 22–32)
Calcium: 8.1 mg/dL — ABNORMAL LOW (ref 8.9–10.3)
Creatinine, Ser: 0.98 mg/dL (ref 0.61–1.24)
GFR calc Af Amer: >60 mL/min (ref >60)
GFR calc non Af Amer: >60 mL/min (ref >60)
Glucose, Bld: 116 mg/dL — ABNORMAL HIGH (ref 70–99)
Potassium: 3.6 mmol/L (ref 3.5–5.1)
Sodium: 140 mmol/L (ref 135–145)
Total Bilirubin: 1.3 mg/dL — ABNORMAL HIGH (ref 0.3–1.2)
Total Protein: 6 g/dL — ABNORMAL LOW (ref 6.5–8.1)

## 2018-08-12 LAB — MAGNESIUM: MAGNESIUM: 2.4 mg/dL (ref 1.7–2.4)

## 2018-08-12 MED ORDER — POTASSIUM CHLORIDE CRYS ER 20 MEQ PO TBCR
20.0000 meq | EXTENDED_RELEASE_TABLET | Freq: Two times a day (BID) | ORAL | Status: DC
  Administered 2018-08-12 (×2): 20 meq via ORAL
  Filled 2018-08-12 (×2): qty 1

## 2018-08-12 MED ORDER — KCL IN DEXTROSE-NACL 40-5-0.9 MEQ/L-%-% IV SOLN
INTRAVENOUS | Status: DC
  Administered 2018-08-12: 13:00:00 via INTRAVENOUS
  Filled 2018-08-12: qty 1000

## 2018-08-12 MED ORDER — ENOXAPARIN SODIUM 60 MG/0.6ML ~~LOC~~ SOLN
0.5000 mg/kg | Freq: Every day | SUBCUTANEOUS | Status: DC
  Administered 2018-08-13 – 2018-08-14 (×2): 50 mg via SUBCUTANEOUS
  Filled 2018-08-12 (×2): qty 0.6

## 2018-08-12 MED ORDER — SODIUM CHLORIDE 0.9 % IV SOLN
INTRAVENOUS | Status: DC
  Administered 2018-08-12 – 2018-08-13 (×2): via INTRAVENOUS

## 2018-08-12 NOTE — Evaluation (Signed)
Physical Therapy Evaluation Patient Details Name: Marcus BELLAND MRN: [id number] DOB: [birth year] Today's Date: 08/12/2018   History of Present Illness  Pt admitted 08/10/18 for post-op ileus, s/p R TKR on 08/05/18. PMH includes CAD s/p PCI with stent in 2004, basal cell carcinoma, DJD, GERD, HLD, HTN, DOE, shoulder arthroscopy, heart cath.   Clinical Impression   Pt presents with abdominal pain and distention, R knee pain, decreased R knee ROM, decreased recall of TKR LE exercises, and decreased activity tolerance secondary to pain. Pt to benefit from acute PT to address deficits. Pt ambulated 70 ft with min guard assist this session without KI donned. Pt with no buckling or difficulty during ambulation. Pt reporting abdominal discomfort makes him feel "lousy" during physical activity. Pt instructed in ankle pumps, heel slides with assist of gait belt, quad sets, and hip abduction/adduction to perform daily until OPPT begins. PT to progress mobility as tolerated, and will continue to follow acutely.      Follow Up Recommendations Follow surgeon's recommendation for DC plan and follow-up therapies;Supervision for mobility/OOB(OPPT)    Equipment Recommendations  None recommended by PT    Recommendations for Other Services       Precautions / Restrictions Precautions Precautions: Fall;Knee Restrictions Weight Bearing Restrictions: No Other Position/Activity Restrictions: WBAT      Mobility  Bed Mobility Overal bed mobility: Needs Assistance Bed Mobility: Supine to Sit;Sit to Supine     Supine to sit: Supervision Sit to supine: Supervision   General bed mobility comments: Supervision for safety, no verbal cuing required.   Transfers Overall transfer level: Needs assistance Equipment used: Rolling walker (2 wheeled) Transfers: Sit to/from Stand Sit to Stand: Min guard         General transfer comment: Min guard for safety, pt with appropriate hand positioning upon rising.  PT d/ced use of R KI due to ability to perform SLR, R knee stability in WB.   Ambulation/Gait Ambulation/Gait assistance: Min guard Gait Distance (Feet): 70 Feet Assistive device: Rolling walker (2 wheeled) Gait Pattern/deviations: Step-to pattern;Step-through pattern;Decreased stride length Gait velocity: decr    General Gait Details: Pt starting with step-to gait, very anxious about ambulation without R KI. Pt encouraged to perform heel to toe ambulation, flex knee with ambulation. Verbal cuing for sequencing, placement in RW. Pt ambulated 700 ft with KI donned earlier in the day.   Stairs            Wheelchair Mobility    Modified Rankin (Stroke Patients Only)       Balance Overall balance assessment: Mild deficits observed, not formally tested                                           Pertinent Vitals/Pain Pain Assessment: 0-10 Pain Score: 2  Pain Location: abdomen, R knee   Pain Descriptors / Indicators: Discomfort Pain Intervention(s): Limited activity within patient's tolerance;Repositioned;Monitored during session    Home Living Family/patient expects to be discharged to:: Private residence Living Arrangements: Spouse/significant other Available Help at Discharge: Family;Available 24 hours/day Type of Home: House Home Access: Stairs to enter Entrance Stairs-Rails: Psychiatric nurse of Steps: 6 Home Layout: One level Home Equipment: Walker - 2 wheels;Crutches;Bedside commode      Prior Function Level of Independence: Independent with assistive device(s)               Hand Dominance  Dominant Hand: Right    Extremity/Trunk Assessment   Upper Extremity Assessment Upper Extremity Assessment: Overall WFL for tasks assessed    Lower Extremity Assessment Lower Extremity Assessment: RLE deficits/detail RLE Deficits / Details: suspected post-surgical RLE weakness; able to perform quad set, SLR with 5* quad lag,  ankle pumps, hip abduction/adduction.  RLE Sensation: WNL    Cervical / Trunk Assessment Cervical / Trunk Assessment: Normal  Communication   Communication: No difficulties  Cognition Arousal/Alertness: Awake/alert Behavior During Therapy: WFL for tasks assessed/performed Overall Cognitive Status: Within Functional Limits for tasks assessed                                        General Comments      Exercises Total Joint Exercises Ankle Circles/Pumps: AROM;Both;10 reps;Supine Quad Sets: AROM;Right;5 reps;Supine Heel Slides: AAROM;Right;10 reps;Supine Hip ABduction/ADduction: AROM;Right;5 reps;Supine Goniometric ROM: Knee AAROM ~5-50*, limited by stiffness and pain    Assessment/Plan    PT Assessment Patient needs continued PT services  PT Problem List Decreased strength;Decreased mobility;Decreased range of motion;Decreased knowledge of use of DME;Pain;Decreased knowledge of precautions       PT Treatment Interventions DME instruction;Therapeutic activities;Gait training;Therapeutic exercise;Patient/family education;Balance training;Functional mobility training    PT Goals (Current goals can be found in the Care Plan section)  Acute Rehab PT Goals Patient Stated Goal: go home  PT Goal Formulation: With patient Time For Goal Achievement: 08/26/18 Potential to Achieve Goals: Good    Frequency Min 3X/week   Barriers to discharge        Co-evaluation               AM-PAC PT "6 Clicks" Mobility  Outcome Measure Help needed turning from your back to your side while in a flat bed without using bedrails?: None Help needed moving from lying on your back to sitting on the side of a flat bed without using bedrails?: None Help needed moving to and from a bed to a chair (including a wheelchair)?: A Little Help needed standing up from a chair using your arms (e.g., wheelchair or bedside chair)?: A Little Help needed to walk in hospital room?: A  Little Help needed climbing 3-5 steps with a railing? : A Little 6 Click Score: 20    End of Session   Activity Tolerance: Patient tolerated treatment well Patient left: in bed;with call Greer/phone within reach;with family/visitor present Nurse Communication: Mobility status PT Visit Diagnosis: Other abnormalities of gait and mobility (R26.89);Difficulty in walking, not elsewhere classified (R26.2)    Time: 6433-2951 PT Time Calculation (min) (ACUTE ONLY): 24 min   Charges:   PT Evaluation $PT Eval Low Complexity: 1 Low PT Treatments $Gait Training: 8-22 mins       Julien Girt, PT Acute Rehabilitation Services Pager [phone]  Office [phone]   Rihan Schueler D Auriella Wieand 08/12/2018, 5:10 PM

## 2018-08-12 NOTE — Consult Note (Addendum)
Reason for Consult:Ileus Referring Physician: Tycen, Dockter is an 67 y.o. male.  HPI: Patient is a 67 year old male who underwent total right knee arthroplasty on 08/05/2018 by Dr. Hector Shade. He was discharged 11/27/129.   He returned on 08/10/2018 to the ED feeling bloated, having abdominal pain and no appetite.  He reported 1 bowel movement prior to discharge but none since discharge.  He was seen at [facility] [place].    Work-up in the ED shows he is afebrile.  Labs showed a sodium of 130, potassium of 3.5, glucose 120, WBC of 12,000.  Acute abdominal film showed dilated air-filled colonic loops over the midline to the lower abdomen possible sigmoid volvulus.  CT scan of the abdomen pelvis without contrast was then obtained.  There was gaseous distention of the colon all the way to the level of the rectum.  Small bowel loops have a normal caliber.  The appendix appeared normal.  It was their opinion this was consistent with a postoperative ileus.  He was admitted to medicine with postoperative ileus.  He was made n.p.o. and placed on IV fluids and plan to control his pain without opioids.  He was started on MiraLAX 3 times daily and Dulcolax 08/11/2018.  Placed on some potassium replacement.  Today he is afebrile his vital signs are stable.  Sodium is improved to 140.  Potassium is 3.6.  Bilirubin is 1.3 down from 1.7 on admission.  A 2 view abdomen is pending we are asked to see. Pain control has been with plain Tylenol 1 g every 8 H ordered currently.  Toradol 15 mg every 8, but he had 4 doses yesterday, 1 dose of Toradol this a.m.  Past Medical History:  Diagnosis Date  . ASCVD (arteriosclerotic cardiovascular disease)   . Cancer (HCC)    basal cell nose  . Coronary artery disease    s/p PCI of the prox RCA with residual 40-50% distal RCA 11/2002  . DJD (degenerative joint disease)   . GERD (gastroesophageal reflux disease)   . Hyperlipidemia     Past Surgical History:   Procedure Laterality Date  . CARDIAC CATHETERIZATION     One cardiac stent 2002   . JOINT REPLACEMENT     right total knee Alsuisio 08-05-18  . KNEE ARTHROCENTESIS    . NOSE SURGERY    . SHOULDER ARTHROSCOPY     bone spur left  . TOTAL KNEE ARTHROPLASTY Right 08/05/2018   Procedure: RIGHT TOTAL KNEE ARTHROPLASTY;  Surgeon: Gaynelle Arabian, MD;  Location: [facility];  Service: Orthopedics;  Laterality: Right;  Adductor Block    Family History  Problem Relation Age of Onset  . Diabetes Father   . Epilepsy Sister     Social History:  reports that he has never smoked. He has never used smokeless tobacco. He reports that he drinks alcohol. He reports that he does not use drugs.  Allergies:  Allergies  Allergen Reactions  . Gemfibrozil Other (See Comments)    Unknown  . Isovue [Iopamidol] Hives    Pt reports that he was told after having a heart catherization that he was allergic to the contrast used, states he had hives  . Niaspan [Niacin Er] Other (See Comments)    Unknown  . Tape Other (See Comments)    Irritation    Medications:  Prior to Admission:  Medications Prior to Admission  Medication Sig Dispense Refill Last Dose  . atorvastatin (LIPITOR) 20 MG tablet TAKE  ONE TABLET BY MOUTH DAILY (Patient taking differently: Take 20 mg by mouth at bedtime. ) 90 tablet 3 08/09/2018 at Unknown time  . methocarbamol (ROBAXIN) 500 MG tablet Take 1 tablet (500 mg total) by mouth every 6 (six) hours as needed for muscle spasms. 40 tablet 0 08/10/2018 at Unknown time  . oxyCODONE (OXY IR/ROXICODONE) 5 MG immediate release tablet Take 1-2 tablets (5-10 mg total) by mouth every 6 (six) hours as needed for moderate pain (pain score 4-6). 56 tablet 0 Past Week at Unknown time  . ramipril (ALTACE) 5 MG capsule TAKE ONE CAPSULE BY MOUTH DAILY (Patient taking differently: Take 5 mg by mouth daily. ) 90 capsule 3 08/10/2018 at Unknown time  . rivaroxaban (XARELTO) 10 MG TABS tablet Take 1 tablet (10  mg total) by mouth daily with breakfast for 19 days. Then resume one 81 mg aspirin once a day. 19 tablet 0 08/10/2018 at 0700  . traMADol (ULTRAM) 50 MG tablet Take 1-2 tablets (50-100 mg total) by mouth every 6 (six) hours as needed for moderate pain. 40 tablet 0 08/10/2018 at 0700   Scheduled: . acetaminophen  1,000 mg Oral Q8H  . enoxaparin (LOVENOX) injection  40 mg Subcutaneous Daily  . ketorolac  15 mg Intravenous Q8H  . polyethylene glycol  17 g Oral TID   Continuous: . sodium chloride 75 mL/hr at 08/12/18 0720   UYQ:[id number], ondansetron (ZOFRAN) IV, traMADol Anti-infectives (From admission, onward)   None      Results for orders placed or performed during the hospital encounter of 08/10/18 (from the past 48 hour(s))  Comprehensive metabolic panel     Status: Abnormal   Collection Time: 08/10/18  3:28 PM  Result Value Ref Range   Sodium 130 (L) 135 - 145 mmol/L   Potassium 3.5 3.5 - 5.1 mmol/L   Chloride 98 98 - 111 mmol/L   CO2 23 22 - 32 mmol/L   Glucose, Bld 120 (H) 70 - 99 mg/dL   BUN 19 8 - 23 mg/dL   Creatinine, Ser 0.90 0.61 - 1.24 mg/dL   Calcium 8.1 (L) 8.9 - 10.3 mg/dL   Total Protein 6.5 6.5 - 8.1 g/dL   Albumin 3.2 (L) 3.5 - 5.0 g/dL   AST 34 15 - 41 U/L   ALT 34 0 - 44 U/L   Alkaline Phosphatase 43 38 - 126 U/L   Total Bilirubin 1.7 (H) 0.3 - 1.2 mg/dL   GFR calc non Af Amer >60 >60 mL/min   GFR calc Af Amer >60 >60 mL/min   Anion gap 9 5 - 15    Comment: Performed at [facility], [address] [place], [place] [address]  Lipase, blood     Status: None   Collection Time: 08/10/18  3:28 PM  Result Value Ref Range   Lipase 33 11 - 51 U/L    Comment: Performed at [facility], [address] [place], [place] [address]  CBC with Differential     Status: Abnormal   Collection Time: 08/10/18  3:28 PM  Result Value Ref Range   WBC 12.0 (H) 4.0 - 10.5 K/uL   RBC 3.90 (L) 4.22 - 5.81 MIL/uL   Hemoglobin 11.9 (L) 13.0 - 17.0 g/dL    HCT 36.6 (L) 39.0 - 52.0 %   MCV 93.8 80.0 - 100.0 fL   MCH 30.5 26.0 - 34.0 pg   MCHC 32.5 30.0 - 36.0 g/dL   RDW 11.9  11.5 - 15.5 %   Platelets 222 150 - 400 K/uL   nRBC 0.0 0.0 - 0.2 %   Neutrophils Relative % 77 %   Neutro Abs 9.3 (H) 1.7 - 7.7 K/uL   Lymphocytes Relative 7 %   Lymphs Abs 0.9 0.7 - 4.0 K/uL   Monocytes Relative 14 %   Monocytes Absolute 1.6 (H) 0.1 - 1.0 K/uL   Eosinophils Relative 1 %   Eosinophils Absolute 0.1 0.0 - 0.5 K/uL   Basophils Relative 0 %   Basophils Absolute 0.0 0.0 - 0.1 K/uL   Immature Granulocytes 1 %   Abs Immature Granulocytes 0.09 (H) 0.00 - 0.07 K/uL    Comment: Performed at [facility], [address] [place], [place] [address]  HIV antibody (Routine Testing)     Status: None   Collection Time: 08/11/18  3:54 AM  Result Value Ref Range   HIV Screen 4th Generation wRfx Non Reactive Non Reactive    Comment: (NOTE) Performed At: [facility] [place], [place] [id number] Rush Farmer MD YY:[id number]   Comprehensive metabolic panel     Status: Abnormal   Collection Time: 08/11/18  3:54 AM  Result Value Ref Range   Sodium 135 135 - 145 mmol/L   Potassium 3.3 (L) 3.5 - 5.1 mmol/L   Chloride 101 98 - 111 mmol/L   CO2 28 22 - 32 mmol/L   Glucose, Bld 105 (H) 70 - 99 mg/dL   BUN 19 8 - 23 mg/dL   Creatinine, Ser 0.92 0.61 - 1.24 mg/dL   Calcium 8.1 (L) 8.9 - 10.3 mg/dL   Total Protein 6.0 (L) 6.5 - 8.1 g/dL   Albumin 3.1 (L) 3.5 - 5.0 g/dL   AST 27 15 - 41 U/L   ALT 30 0 - 44 U/L   Alkaline Phosphatase 44 38 - 126 U/L   Total Bilirubin 1.6 (H) 0.3 - 1.2 mg/dL   GFR calc non Af Amer >60 >60 mL/min   GFR calc Af Amer >60 >60 mL/min   Anion gap 6 5 - 15    Comment: Performed at [facility], [place] [address], [place], [place] [address]  Comprehensive metabolic panel     Status: Abnormal   Collection Time: 08/12/18  4:16 AM  Result Value Ref Range   Sodium 140 135 - 145 mmol/L    Potassium 3.6 3.5 - 5.1 mmol/L   Chloride 106 98 - 111 mmol/L   CO2 26 22 - 32 mmol/L   Glucose, Bld 116 (H) 70 - 99 mg/dL   BUN 16 8 - 23 mg/dL   Creatinine, Ser 0.98 0.61 - 1.24 mg/dL   Calcium 8.1 (L) 8.9 - 10.3 mg/dL   Total Protein 6.0 (L) 6.5 - 8.1 g/dL   Albumin 2.8 (L) 3.5 - 5.0 g/dL   AST 28 15 - 41 U/L   ALT 33 0 - 44 U/L   Alkaline Phosphatase 44 38 - 126 U/L   Total Bilirubin 1.3 (H) 0.3 - 1.2 mg/dL   GFR calc non Af Amer >60 >60 mL/min   GFR calc Af Amer >60 >60 mL/min   Anion gap 8 5 - 15    Comment: Performed at [facility], [place] [address] [place], [place] [address]  Magnesium     Status: None   Collection Time: 08/12/18  4:16 AM  Result Value Ref Range   Magnesium 2.4 1.7 - 2.4 mg/dL  Comment: Performed at [facility], [place] [address], [place], [place] [address]    Ct Abdomen Pelvis Wo Contrast  Result Date: 08/10/2018 CLINICAL DATA:  Abdominal pain. Recent surgery on 08/05/2018. Constipation, abdominal pain and bloating. EXAM: CT ABDOMEN AND PELVIS WITHOUT CONTRAST TECHNIQUE: Multidetector CT imaging of the abdomen and pelvis was performed following the standard protocol without IV contrast. COMPARISON:  None. FINDINGS: Lower chest: No acute abnormality. Hepatobiliary: Hepatic steatosis noted. No focal liver abnormality identified. Gallbladder sludge layering within the dependent portion of the gallbladder noted. No gallbladder wall thickening, pericholecystic fluid or biliary dilatation. Pancreas: Unremarkable. No pancreatic ductal dilatation or surrounding inflammatory changes. Spleen: Normal in size without focal abnormality. Adrenals/Urinary Tract: Normal adrenal glands. Bilateral kidney cysts are identified. These are incompletely characterized without IV contrast. The largest arises from the upper pole of right kidney measuring 9.3 cm. No kidney stones or hydronephrosis identified. No ureterolithiasis or bladder calculi.  Stomach/Bowel: Stomach appears normal. The small bowel loops have a normal caliber. The appendix is visualized and appears normal. There is diffuse gaseous distension of the colon all the way to the level of the rectum. No mass identified. Vascular/Lymphatic: Normal appearance of the abdominal aorta. No enlarged lymph nodes within the abdomen or pelvis. No inguinal adenopathy. Reproductive: Prostate is unremarkable. Other: No free fluid or fluid collections. Musculoskeletal: There is asymmetric edema and subcutaneous fat stranding overlying the right hip which may be postoperative. No acute or significant osseous abnormalities identified. IMPRESSION: 1. There is diffuse gaseous distension of the colon up to the level of the rectum. Favor postoperative ileus over distal bowel obstruction. 2. Hepatic steatosis. 3. Bilateral kidney cysts, incompletely characterized without IV contrast 4.  Aortic Atherosclerosis (ICD10-I70.0). Electronically Signed   By: Kerby Moors M.D.   On: 08/10/2018 17:30   Dg Abd Acute W/chest  Result Date: 08/10/2018 CLINICAL DATA:  Abdominal pain and distension 3-4 days. EXAM: DG ABDOMEN ACUTE W/ 1V CHEST COMPARISON:  None. FINDINGS: Lungs are adequately inflated and otherwise clear. Cardiomediastinal silhouette and remainder of the chest is unremarkable. Abdominopelvic images demonstrate multiple air-filled colonic loops with prominent colonic loop over the midline abdomen on the supine film measuring 13.1 cm which may represent dilated sigmoid colon. Possible dilatation of a small bowel loop over the right mid to upper abdomen measuring 6.1 cm in diameter. There is no free peritoneal air. Mild degenerate change of the spine. IMPRESSION: Dilated air-filled colonic loop over the midline mid to lower abdomen likely sigmoid colon. Sigmoid volvulus is possible. Possible single dilated small bowel loop over the right mid to upper abdomen. Consider abdominopelvic CT scan for further  evaluation. No acute cardiopulmonary disease. Electronically Signed   By: Marin Olp M.D.   On: 08/10/2018 16:47    Review of Systems  Constitutional: Negative.   HENT: Negative.   Eyes: Negative.   Respiratory: Negative.   Cardiovascular: Negative.   Gastrointestinal: Positive for abdominal pain (abdominal distension) and nausea.  Genitourinary: Negative.   Musculoskeletal: Positive for joint pain.       Leg is stiff and he has not been doing much exercise since admission.  Skin: Negative.   Neurological: Negative.   Endo/Heme/Allergies: Bruises/bleeds easily (Huge hematoma right thigh).  Psychiatric/Behavioral: Negative.    Blood pressure 138/77, pulse 64, temperature 99 F (37.2 C), temperature source Oral, resp. rate 18, height 5\' 9"  (1.753 m), weight 99 kg, SpO2 98 %. Physical Exam  Constitutional: He is oriented to person, place, and time. He  appears well-developed and well-nourished. No distress.  HENT:  Head: Normocephalic and atraumatic.  Mouth/Throat: Oropharynx is clear and moist. No oropharyngeal exudate.  Eyes: Right eye exhibits no discharge. Left eye exhibits no discharge. No scleral icterus.  pupils are equal  Neck: Normal range of motion. Neck supple. No JVD present. No tracheal deviation present. No thyromegaly present.  Cardiovascular: Normal rate, regular rhythm, normal heart sounds and intact distal pulses.  No murmur heard. Respiratory: Effort normal and breath sounds normal. No respiratory distress. He has no wheezes. He has no rales. He exhibits no tenderness.  GI: Soft. He exhibits distension (some). He exhibits no mass. There is no tenderness. There is no rebound and no guarding.  Decreased BS  Musculoskeletal: He exhibits no edema.  He has a splint on his right lower extremity above and below the knee.  Lymphadenopathy:    He has no cervical adenopathy.  Neurological: He is alert and oriented to person, place, and time. No cranial nerve deficit.   Skin: Skin is warm. No rash noted. He is not diaphoretic. No erythema. No pallor.  Psychiatric: He has a normal mood and affect. His behavior is normal. Judgment and thought content normal.    Assessment/Plan: Postop ileus Total right knee arthroplasty 08/05/2018 CAD history of stent Hyperlipidemia Hx GERD  Plan: He has been started on clears I told him to go slow with this.  Discontinue the MiraLAX.  Replace potassium up to 4.0.  PT evaluation and get him ambulating.  Continue to limit opioids. Repeat film is pending for today.  Yashvi Jasinski 08/12/2018, 10:50 AM

## 2018-08-12 NOTE — Discharge Summary (Signed)
Physician Discharge Summary   Patient ID: Marcus Greer MRN: [id number] DOB/AGE: Sep 09, 1951 67 y.o.  Admit date: 08/05/2018 Discharge date: 08/07/2018  Primary Diagnosis: Osteoarthritis, right knee   Admission Diagnoses:  Past Medical History:  Diagnosis Date  . ASCVD (arteriosclerotic cardiovascular disease)   . Cancer (HCC)    basal cell nose  . Coronary artery disease    s/p PCI of the prox RCA with residual 40-50% distal RCA 11/2002  . DJD (degenerative joint disease)   . GERD (gastroesophageal reflux disease)   . Hyperlipidemia    Discharge Diagnoses:   Principal Problem:   OA (osteoarthritis) of knee  Estimated body mass index is 32.23 kg/m as calculated from the following:   Height as of this encounter: '5\' 9"'  (1.753 m).   Weight as of this encounter: 99 kg.  Procedure:  Procedure(s) (LRB): RIGHT TOTAL KNEE ARTHROPLASTY (Right)   Consults: None  HPI: Marcus Greer is a 67 y.o. year old male with end stage OA of his right knee with progressively worsening pain and dysfunction. He has constant pain, with activity and at rest and significant functional deficits with difficulties even with ADLs. He has had extensive non-op management including analgesics, injections of cortisone and viscosupplements, and home exercise program, but remains in significant pain with significant dysfunction. Radiographs show bone on bone arthritis medial and patellofemoral. He presents now for right Total Knee Arthroplasty.    Laboratory Data: Admission on 08/05/2018, Discharged on 08/07/2018  Component Date Value Ref Range Status  . WBC 08/06/2018 18.6* 4.0 - 10.5 K/uL Final  . RBC 08/06/2018 4.65  4.22 - 5.81 MIL/uL Final  . Hemoglobin 08/06/2018 14.5  13.0 - 17.0 g/dL Final  . HCT 08/06/2018 44.3  39.0 - 52.0 % Final  . MCV 08/06/2018 95.3  80.0 - 100.0 fL Final  . MCH 08/06/2018 31.2  26.0 - 34.0 pg Final  . MCHC 08/06/2018 32.7  30.0 - 36.0 g/dL Final  . RDW 08/06/2018 12.0   11.5 - 15.5 % Final  . Platelets 08/06/2018 198  150 - 400 K/uL Final  . nRBC 08/06/2018 0.0  0.0 - 0.2 % Final   Performed at [facility], [place] [address], [place], [place] [address]  . Sodium 08/06/2018 138  135 - 145 mmol/L Final  . Potassium 08/06/2018 4.6  3.5 - 5.1 mmol/L Final  . Chloride 08/06/2018 106  98 - 111 mmol/L Final  . CO2 08/06/2018 21* 22 - 32 mmol/L Final  . Glucose, Bld 08/06/2018 137* 70 - 99 mg/dL Final  . BUN 08/06/2018 18  8 - 23 mg/dL Final  . Creatinine, Ser 08/06/2018 1.25* 0.61 - 1.24 mg/dL Final  . Calcium 08/06/2018 8.7* 8.9 - 10.3 mg/dL Final  . GFR calc non Af Amer 08/06/2018 59* >60 mL/min Final  . GFR calc Af Amer 08/06/2018 >60  >60 mL/min Final   Comment: (NOTE) The eGFR has been calculated using the CKD EPI equation. This calculation has not been validated in all clinical situations. eGFR's persistently <60 mL/min signify possible Chronic Kidney Disease.   Georgiann Hahn gap 08/06/2018 11  5 - 15 Final   Performed at [facility], [place] [address] [place], [place] [address]  . WBC 08/07/2018 21.7* 4.0 - 10.5 K/uL Final  . RBC 08/07/2018 4.35  4.22 - 5.81 MIL/uL Final  . Hemoglobin 08/07/2018 13.7  13.0 - 17.0 g/dL Final  . HCT 08/07/2018 41.6  39.0 - 52.0 % Final  .  MCV 08/07/2018 95.6  80.0 - 100.0 fL Final  . MCH 08/07/2018 31.5  26.0 - 34.0 pg Final  . MCHC 08/07/2018 32.9  30.0 - 36.0 g/dL Final  . RDW 08/07/2018 12.4  11.5 - 15.5 % Final  . Platelets 08/07/2018 183  150 - 400 K/uL Final  . nRBC 08/07/2018 0.0  0.0 - 0.2 % Final   Performed at [facility], [place] [address] [place], [place] [address]  . Sodium 08/07/2018 138  135 - 145 mmol/L Final  . Potassium 08/07/2018 4.4  3.5 - 5.1 mmol/L Final  . Chloride 08/07/2018 105  98 - 111 mmol/L Final  . CO2 08/07/2018 28  22 - 32 mmol/L Final  . Glucose, Bld 08/07/2018 122* 70 - 99 mg/dL Final  . BUN 08/07/2018 21  8 - 23 mg/dL Final  .  Creatinine, Ser 08/07/2018 1.13  0.61 - 1.24 mg/dL Final  . Calcium 08/07/2018 8.5* 8.9 - 10.3 mg/dL Final  . GFR calc non Af Amer 08/07/2018 >60  >60 mL/min Final  . GFR calc Af Amer 08/07/2018 >60  >60 mL/min Final  . Anion gap 08/07/2018 5  5 - 15 Final   Performed at [facility], [place] [address], [place], [place] [address]  Hospital Outpatient Visit on 07/30/2018  Component Date Value Ref Range Status  . aPTT 07/30/2018 30  24 - 36 seconds Final   Performed at [facility], [place] [address], [place], [place] [address]  . WBC 07/30/2018 7.6  4.0 - 10.5 K/uL Final  . RBC 07/30/2018 5.46  4.22 - 5.81 MIL/uL Final  . Hemoglobin 07/30/2018 17.1* 13.0 - 17.0 g/dL Final  . HCT 07/30/2018 50.9  39.0 - 52.0 % Final  . MCV 07/30/2018 93.2  80.0 - 100.0 fL Final  . MCH 07/30/2018 31.3  26.0 - 34.0 pg Final  . MCHC 07/30/2018 33.6  30.0 - 36.0 g/dL Final  . RDW 07/30/2018 12.1  11.5 - 15.5 % Final  . Platelets 07/30/2018 188  150 - 400 K/uL Final  . nRBC 07/30/2018 0.0  0.0 - 0.2 % Final   Performed at [facility], [place] [address] [place], [place] [address]  . Sodium 07/30/2018 140  135 - 145 mmol/L Final  . Potassium 07/30/2018 4.1  3.5 - 5.1 mmol/L Final  . Chloride 07/30/2018 106  98 - 111 mmol/L Final  . CO2 07/30/2018 24  22 - 32 mmol/L Final  . Glucose, Bld 07/30/2018 126* 70 - 99 mg/dL Final  . BUN 07/30/2018 23  8 - 23 mg/dL Final  . Creatinine, Ser 07/30/2018 1.24  0.61 - 1.24 mg/dL Final  . Calcium 07/30/2018 9.2  8.9 - 10.3 mg/dL Final  . Total Protein 07/30/2018 7.3  6.5 - 8.1 g/dL Final  . Albumin 07/30/2018 4.4  3.5 - 5.0 g/dL Final  . AST 07/30/2018 39  15 - 41 U/L Final  . ALT 07/30/2018 42  0 - 44 U/L Final  . Alkaline Phosphatase 07/30/2018 41  38 - 126 U/L Final  . Total Bilirubin 07/30/2018 0.9  0.3 - 1.2 mg/dL Final  . GFR calc non Af Amer 07/30/2018 59* >60 mL/min Final  . GFR calc Af Amer 07/30/2018 >60  >60  mL/min Final   Comment: (NOTE) The eGFR has been calculated using the CKD EPI equation. This calculation has not been validated in all clinical situations. eGFR's persistently <60 mL/min signify possible Chronic Kidney Disease.   Marland Kitchen  Anion gap 07/30/2018 10  5 - 15 Final   Performed at [facility], [place] [address] [place], [place] [address]  . Prothrombin Time 07/30/2018 12.6  11.4 - 15.2 seconds Final  . INR 07/30/2018 0.96   Final   Performed at [facility], [place] [address], [place], [place] [address]  . ABO/RH(D) 07/30/2018 A POS   Final  . Antibody Screen 07/30/2018 NEG   Final  . Sample Expiration 07/30/2018 08/08/2018   Final  . Extend sample reason 07/30/2018    Final                   Value:NO TRANSFUSIONS OR PREGNANCY IN THE PAST 3 MONTHS Performed at [place] [address] [place], [place] [address]   . MRSA, PCR 07/30/2018 NEGATIVE  NEGATIVE Final  . Staphylococcus aureus 07/30/2018 NEGATIVE  NEGATIVE Final   Comment: (NOTE) The Xpert SA Assay (FDA approved for NASAL specimens in patients 36 years of age and older), is one component of a comprehensive surveillance program. It is not intended to diagnose infection nor to guide or monitor treatment. Performed at [facility], [place] [address], [place], [place] [address]   . ABO/RH(D) 07/30/2018    Final                   Value:A POS Performed at [facility], [place] [address] [place], [place] [address]      X-Rays:Ct Abdomen Pelvis Wo Contrast  Result Date: 08/10/2018 CLINICAL DATA:  Abdominal pain. Recent surgery on 08/05/2018. Constipation, abdominal pain and bloating. EXAM: CT ABDOMEN AND PELVIS WITHOUT CONTRAST TECHNIQUE: Multidetector CT imaging of the abdomen and pelvis was performed following the standard protocol without IV contrast. COMPARISON:  None. FINDINGS: Lower chest: No acute abnormality. Hepatobiliary:  Hepatic steatosis noted. No focal liver abnormality identified. Gallbladder sludge layering within the dependent portion of the gallbladder noted. No gallbladder wall thickening, pericholecystic fluid or biliary dilatation. Pancreas: Unremarkable. No pancreatic ductal dilatation or surrounding inflammatory changes. Spleen: Normal in size without focal abnormality. Adrenals/Urinary Tract: Normal adrenal glands. Bilateral kidney cysts are identified. These are incompletely characterized without IV contrast. The largest arises from the upper pole of right kidney measuring 9.3 cm. No kidney stones or hydronephrosis identified. No ureterolithiasis or bladder calculi. Stomach/Bowel: Stomach appears normal. The small bowel loops have a normal caliber. The appendix is visualized and appears normal. There is diffuse gaseous distension of the colon all the way to the level of the rectum. No mass identified. Vascular/Lymphatic: Normal appearance of the abdominal aorta. No enlarged lymph nodes within the abdomen or pelvis. No inguinal adenopathy. Reproductive: Prostate is unremarkable. Other: No free fluid or fluid collections. Musculoskeletal: There is asymmetric edema and subcutaneous fat stranding overlying the right hip which may be postoperative. No acute or significant osseous abnormalities identified. IMPRESSION: 1. There is diffuse gaseous distension of the colon up to the level of the rectum. Favor postoperative ileus over distal bowel obstruction. 2. Hepatic steatosis. 3. Bilateral kidney cysts, incompletely characterized without IV contrast 4.  Aortic Atherosclerosis (ICD10-I70.0). Electronically Signed   By: Kerby Moors M.D.   On: 08/10/2018 17:30   Dg Abd Acute W/chest  Result Date: 08/10/2018 CLINICAL DATA:  Abdominal pain and distension 3-4 days. EXAM: DG ABDOMEN ACUTE W/ 1V CHEST COMPARISON:  None. FINDINGS: Lungs are adequately inflated and otherwise clear. Cardiomediastinal silhouette and remainder of  the chest is unremarkable. Abdominopelvic images demonstrate  multiple air-filled colonic loops with prominent colonic loop over the midline abdomen on the supine film measuring 13.1 cm which may represent dilated sigmoid colon. Possible dilatation of a small bowel loop over the right mid to upper abdomen measuring 6.1 cm in diameter. There is no free peritoneal air. Mild degenerate change of the spine. IMPRESSION: Dilated air-filled colonic loop over the midline mid to lower abdomen likely sigmoid colon. Sigmoid volvulus is possible. Possible single dilated small bowel loop over the right mid to upper abdomen. Consider abdominopelvic CT scan for further evaluation. No acute cardiopulmonary disease. Electronically Signed   By: Marin Olp M.D.   On: 08/10/2018 16:47    EKG: Orders placed or performed in visit on 06/26/18  . EKG 12-Lead     Hospital Course: Marcus Greer is a 67 y.o. who was admitted to [facility]. They were brought to the operating room on 08/05/2018 and underwent Procedure(s): RIGHT TOTAL KNEE ARTHROPLASTY.  Patient tolerated the procedure well and was later transferred to the recovery room and then to the orthopaedic floor for postoperative care. They were given PO and IV analgesics for pain control following their surgery. They were given 24 hours of postoperative antibiotics of  Anti-infectives (From admission, onward)   Start     Dose/Rate Route Frequency Ordered Stop   08/05/18 2000  ceFAZolin (ANCEF) IVPB 2g/100 mL premix     2 g 200 mL/hr over 30 Minutes Intravenous Every 6 hours 08/05/18 1637 08/06/18 0132   08/05/18 1115  ceFAZolin (ANCEF) IVPB 2g/100 mL premix     2 g 200 mL/hr over 30 Minutes Intravenous On call to O.R. 08/05/18 1100 08/05/18 1427     and started on DVT prophylaxis in the form of Xarelto.   PT and OT were ordered for total joint protocol. Discharge planning consulted to help with postop disposition and equipment needs. Patient had a decent  night on the evening of surgery. They started to get up OOB with therapy on POD #0. Hemovac drain was pulled without difficulty on day one. Dressing with moderate amount of bloody drainage that AM, new dressing applied. Hemoglobin was stable at 14.5. Continued to work with therapy into POD #2. Pt was seen during rounds on day two and was ready to go home pending progress with therapy. Dressing was changed and the incision was clean with minimal amount of bloody drainage. Pt worked with therapy for one additional session and was meeting their goals. He was discharged to home later that day in stable condition.  Diet: Cardiac diet Activity: WBAT Follow-up: in 2 weeks with Dr. Wynelle Link Disposition: Home with outpatient physical therapy at [facility] Discharged Condition: stable   Discharge Instructions    Call MD / Call 911   Complete by:  As directed    If you experience chest pain or shortness of breath, CALL 911 and be transported to the hospital emergency room.  If you develope a fever above 101 F, pus (white drainage) or increased drainage or redness at the wound, or calf pain, call your surgeon's office.   Change dressing   Complete by:  As directed    Change the dressing daily with sterile 4 x 4 inch gauze dressing and apply TED hose.   Constipation Prevention   Complete by:  As directed    Drink plenty of fluids.  Prune juice may be helpful.  You may use a stool softener, such as Colace (over the counter) 100 mg twice a day.  Use MiraLax (over the counter) for constipation as needed.   Diet - low sodium heart healthy   Complete by:  As directed    Discharge instructions   Complete by:  As directed    Dr. Gaynelle Arabian Total Joint Specialist [facility] [address], [place], [place] [address] [phone]  TOTAL KNEE REPLACEMENT POSTOPERATIVE DIRECTIONS  Knee Rehabilitation, Guidelines Following Surgery  Results after knee surgery are often greatly improved when  you follow the exercise, range of motion and muscle strengthening exercises prescribed by your doctor. Safety measures are also important to protect the knee from further injury. Any time any of these exercises cause you to have increased pain or swelling in your knee joint, decrease the amount until you are comfortable again and slowly increase them. If you have problems or questions, call your caregiver or physical therapist for advice.   HOME CARE INSTRUCTIONS  Remove items at home which could result in a fall. This includes throw rugs or furniture in walking pathways.  ICE to the affected knee every three hours for 30 minutes at a time and then as needed for pain and swelling.  Continue to use ice on the knee for pain and swelling from surgery. You may notice swelling that will progress down to the foot and ankle.  This is normal after surgery.  Elevate the leg when you are not up walking on it.   Continue to use the breathing machine which will help keep your temperature down.  It is common for your temperature to cycle up and down following surgery, especially at night when you are not up moving around and exerting yourself.  The breathing machine keeps your lungs expanded and your temperature down. Do not place pillow under knee, focus on keeping the knee straight while resting   DIET You may resume your previous home diet once your are discharged from the hospital.  DRESSING / WOUND CARE / SHOWERING You may shower 3 days after surgery, but keep the wounds dry during showering.  You may use an occlusive plastic wrap (Press'n Seal for example), NO SOAKING/SUBMERGING IN THE BATHTUB.  If the bandage gets wet, change with a clean dry gauze.  If the incision gets wet, pat the wound dry with a clean towel. You may start showering once you are discharged home but do not submerge the incision under water. Just pat the incision dry and apply a dry gauze dressing on daily. Change the surgical dressing  daily and reapply a dry dressing each time.  ACTIVITY Walk with your walker as instructed. Use walker as long as suggested by your caregivers. Avoid periods of inactivity such as sitting longer than an hour when not asleep. This helps prevent blood clots.  You may resume a sexual relationship in one month or when given the OK by your doctor.  You may return to work once you are cleared by your doctor.  Do not drive a car for 6 weeks or until released by you surgeon.  Do not drive while taking narcotics.  WEIGHT BEARING Weight bearing as tolerated with assist device (walker, cane, etc) as directed, use it as long as suggested by your surgeon or therapist, typically at least 4-6 weeks.  POSTOPERATIVE CONSTIPATION PROTOCOL Constipation - defined medically as fewer than three stools per week and severe constipation as less than one stool per week.  One of the most common issues patients have following surgery is constipation.  Even if you have a  regular bowel pattern at home, your normal regimen is likely to be disrupted due to multiple reasons following surgery.  Combination of anesthesia, postoperative narcotics, change in appetite and fluid intake all can affect your bowels.  In order to avoid complications following surgery, here are some recommendations in order to help you during your recovery period.  Colace (docusate) - Pick up an over-the-counter form of Colace or another stool softener and take twice a day as long as you are requiring postoperative pain medications.  Take with a full glass of water daily.  If you experience loose stools or diarrhea, hold the colace until you stool forms back up.  If your symptoms do not get better within 1 week or if they get worse, check with your doctor.  Dulcolax (bisacodyl) - Pick up over-the-counter and take as directed by the product packaging as needed to assist with the movement of your bowels.  Take with a full glass of water.  Use this product as  needed if not relieved by Colace only.   MiraLax (polyethylene glycol) - Pick up over-the-counter to have on hand.  MiraLax is a solution that will increase the amount of water in your bowels to assist with bowel movements.  Take as directed and can mix with a glass of water, juice, soda, coffee, or tea.  Take if you go more than two days without a movement. Do not use MiraLax more than once per day. Call your doctor if you are still constipated or irregular after using this medication for 7 days in a row.  If you continue to have problems with postoperative constipation, please contact the office for further assistance and recommendations.  If you experience "the worst abdominal pain ever" or develop nausea or vomiting, please contact the office immediatly for further recommendations for treatment.  ITCHING  If you experience itching with your medications, try taking only a single pain pill, or even half a pain pill at a time.  You can also use Benadryl over the counter for itching or also to help with sleep.   TED HOSE STOCKINGS Wear the elastic stockings on both legs for three weeks following surgery during the day but you may remove then at night for sleeping.  MEDICATIONS See your medication summary on the "After Visit Summary" that the nursing staff will review with you prior to discharge.  You may have some home medications which will be placed on hold until you complete the course of blood thinner medication.  It is important for you to complete the blood thinner medication as prescribed by your surgeon.  Continue your approved medications as instructed at time of discharge.  PRECAUTIONS If you experience chest pain or shortness of breath - call 911 immediately for transfer to the hospital emergency department.  If you develop a fever greater that 101 F, purulent drainage from wound, increased redness or drainage from wound, foul odor from the wound/dressing, or calf pain - CONTACT YOUR  SURGEON.                                                   FOLLOW-UP APPOINTMENTS Make sure you keep all of your appointments after your operation with your surgeon and caregivers. You should call the office at the above phone number and make an appointment for approximately two weeks after the date  of your surgery or on the date instructed by your surgeon outlined in the "After Visit Summary".   RANGE OF MOTION AND STRENGTHENING EXERCISES  Rehabilitation of the knee is important following a knee injury or an operation. After just a few days of immobilization, the muscles of the thigh which control the knee become weakened and shrink (atrophy). Knee exercises are designed to build up the tone and strength of the thigh muscles and to improve knee motion. Often times heat used for twenty to thirty minutes before working out will loosen up your tissues and help with improving the range of motion but do not use heat for the first two weeks following surgery. These exercises can be done on a training (exercise) mat, on the floor, on a table or on a bed. Use what ever works the best and is most comfortable for you Knee exercises include:  Leg Lifts - While your knee is still immobilized in a splint or cast, you can do straight leg raises. Lift the leg to 60 degrees, hold for 3 sec, and slowly lower the leg. Repeat 10-20 times 2-3 times daily. Perform this exercise against resistance later as your knee gets better.  Quad and Hamstring Sets - Tighten up the muscle on the front of the thigh (Quad) and hold for 5-10 sec. Repeat this 10-20 times hourly. Hamstring sets are done by pushing the foot backward against an object and holding for 5-10 sec. Repeat as with quad sets.  Leg Slides: Lying on your back, slowly slide your foot toward your buttocks, bending your knee up off the floor (only go as far as is comfortable). Then slowly slide your foot back down until your leg is flat on the floor again. Angel Wings:  Lying on your back spread your legs to the side as far apart as you can without causing discomfort.  A rehabilitation program following serious knee injuries can speed recovery and prevent re-injury in the future due to weakened muscles. Contact your doctor or a physical therapist for more information on knee rehabilitation.   IF YOU ARE TRANSFERRED TO A SKILLED REHAB FACILITY If the patient is transferred to a skilled rehab facility following release from the hospital, a list of the current medications will be sent to the facility for the patient to continue.  When discharged from the skilled rehab facility, please have the facility set up the patient's [place] prior to being released. Also, the skilled facility will be responsible for providing the patient with their medications at time of release from the facility to include their pain medication, the muscle relaxants, and their blood thinner medication. If the patient is still at the rehab facility at time of the two week follow up appointment, the skilled rehab facility will also need to assist the patient in arranging follow up appointment in our office and any transportation needs.  MAKE SURE YOU:  Understand these instructions.  Get help right away if you are not doing well or get worse.    Pick up stool softner and laxative for home use following surgery while on pain medications. Do not submerge incision under water. Please use good hand washing techniques while changing dressing each day. May shower starting three days after surgery. Please use a clean towel to pat the incision dry following showers. Continue to use ice for pain and swelling after surgery. Do not use any lotions or creams on the incision until instructed by your surgeon.   Do  not put a pillow under the knee. Place it under the heel.   Complete by:  As directed    Driving restrictions   Complete by:  As directed    No driving for two weeks   TED  hose   Complete by:  As directed    Use stockings (TED hose) for three weeks on both leg(s).  You may remove them at night for sleeping.   Weight bearing as tolerated   Complete by:  As directed      Allergies as of 08/07/2018      Reactions   Gemfibrozil Other (See Comments)   Unknown   Niaspan [niacin Er] Other (See Comments)   Unknown   Tape Other (See Comments)   Irritation      Medication List    STOP taking these medications   aspirin EC 81 MG tablet   Fish Oil 1000 MG Caps   Magnesium Oxide 250 MG Tabs   OSTEO BI-FLEX ADV DOUBLE ST Caps   Turmeric 500 MG Tabs   Vitamin D 50 MCG (2000 UT) Caps     TAKE these medications   atorvastatin 20 MG tablet Commonly known as:  LIPITOR TAKE ONE TABLET BY MOUTH DAILY What changed:  when to take this   methocarbamol 500 MG tablet Commonly known as:  ROBAXIN Take 1 tablet (500 mg total) by mouth every 6 (six) hours as needed for muscle spasms.   oxyCODONE 5 MG immediate release tablet Commonly known as:  Oxy IR/ROXICODONE Take 1-2 tablets (5-10 mg total) by mouth every 6 (six) hours as needed for moderate pain (pain score 4-6).   ramipril 5 MG capsule Commonly known as:  ALTACE TAKE ONE CAPSULE BY MOUTH DAILY   rivaroxaban 10 MG Tabs tablet Commonly known as:  XARELTO Take 1 tablet (10 mg total) by mouth daily with breakfast for 19 days. Then resume one 81 mg aspirin once a day.   traMADol 50 MG tablet Commonly known as:  ULTRAM Take 1-2 tablets (50-100 mg total) by mouth every 6 (six) hours as needed for moderate pain.            Discharge Care Instructions  (From admission, onward)         Start     Ordered   08/06/18 0000  Weight bearing as tolerated     08/06/18 0809   08/06/18 0000  Change dressing    Comments:  Change the dressing daily with sterile 4 x 4 inch gauze dressing and apply TED hose.   08/06/18 0809         Follow-up Information    Gaynelle Arabian, MD. Schedule an appointment as  soon as possible for a visit on 08/20/2018.   Specialty:  Orthopedic Surgery Contact information: [address] [place] [address] [phone]           Signed: Theresa Duty, PA-C Orthopedic Surgery 08/12/2018, 10:09 AM

## 2018-08-12 NOTE — Progress Notes (Signed)
[facility]  PROGRESS NOTE  SRIKAR CHIANG TDS:[id number] DOB: 1951/07/02 DOA: 08/10/2018 PCP: Lawerance Cruel, MD   Brief HPI:   67 year old male with a history of CAD status post PCI with stent in 2004, hypertension, osteoarthritis status post right total knee arthroplasty 08/05/2018 came with abdominal pain.  CT of the abdomen showed findings suggestive of ileus.  Patient not vomiting so no NG tube was inserted.    Subjective   Patient seen and examined, passing flatus, no nausea vomiting.  Still feels very bloated.    Assessment/Plan:     1. Postop ileus-secondary to opiates, patient was discharged on opioid medications after he had total knee arthroplasty.  Opioids are currently on hold.  Continue MiraLAX 17 g 3 times daily, also start Dulcolax 10 mg per rectum as needed.  Will obtain abdominal x-ray films today and consult surgery for further recommendations.  2. Status post right total knee arthroplasty-POD #5, patient is weightbearing as tolerated.  Continue Lovenox for DVT prophylaxis.  3. Hyponatremia-resolved, sodium is 135.  4. Hypokalemia- replaced, continue potassium supplementation  5. Hyperbilirubinemia-bilirubin mildly elevated 1.6, improved from 1.7 yesterday.  AST ALT are within normal limits.  Unclear etiology.  No abnormality seen on CT scan.  Will continue to follow LFTs in the hospital.     CBC: Recent Labs  Lab 08/06/18 0419 08/07/18 0517 08/10/18 1528  WBC 18.6* 21.7* 12.0*  NEUTROABS  --   --  9.3*  HGB 14.5 13.7 11.9*  HCT 44.3 41.6 36.6*  MCV 95.3 95.6 93.8  PLT 198 183 262    Basic Metabolic Panel: Recent Labs  Lab 08/06/18 0419 08/07/18 0517 08/10/18 1528 08/11/18 0354 08/12/18 0416  NA 138 138 130* 135 140  K 4.6 4.4 3.5 3.3* 3.6  CL 106 105 98 101 106  CO2 21* 28 23 28 26   GLUCOSE 137* 122* 120* 105* 116*  BUN 18 21 19 19 16   CREATININE 1.25* 1.13 0.90 0.92 0.98  CALCIUM 8.7* 8.5* 8.1* 8.1* 8.1*  MG  --   --   --    --  2.4     DVT prophylaxis: Lovenox  Code Status: Full code  Family Communication: No family at bedside  Disposition Plan: likely home when medically ready for discharge   Consultants:    Procedures:     Antibiotics:   Anti-infectives (From admission, onward)   None       Objective   Vitals:   08/11/18 0454 08/11/18 1355 08/11/18 2151 08/12/18 0540  BP: 128/78 126/74 (!) 144/78 138/77  Pulse: 67 70 81 64  Resp: 16 15 18 18   Temp: 98.3 F (36.8 C) 98.5 F (36.9 C) 99.4 F (37.4 C) 99 F (37.2 C)  TempSrc: Oral Oral Oral Oral  SpO2: 98% 96% 97% 98%  Weight:      Height:        Intake/Output Summary (Last 24 hours) at 08/12/2018 1450 Last data filed at 08/12/2018 1400 Gross per 24 hour  Intake 3162.3 ml  Output 1925 ml  Net 1237.3 ml   Filed Weights   08/10/18 1411  Weight: 99 kg     Physical Examination:     General: Appears in no acute distress  Cardiovascular: S1-S2, regular, no murmur auscultated  Respiratory: Clear to auscultation bilaterally  Abdomen: Soft, distended, nontender to palpation  Musculoskeletal: No edema of the lower extremities     Data Reviewed: I have personally reviewed following labs and imaging studies  No results found for this or any previous visit (from the past 240 hour(s)).   Liver Function Tests: Recent Labs  Lab 08/10/18 1528 08/11/18 0354 08/12/18 0416  AST 34 27 28  ALT 34 30 33  ALKPHOS 43 44 44  BILITOT 1.7* 1.6* 1.3*  PROT 6.5 6.0* 6.0*  ALBUMIN 3.2* 3.1* 2.8*   Recent Labs  Lab 08/10/18 1528  LIPASE 33   No results for input(s): AMMONIA in the last 168 hours.  Cardiac Enzymes: No results for input(s): CKTOTAL, CKMB, CKMBINDEX, TROPONINI in the last 168 hours. BNP (last 3 results) No results for input(s): BNP in the last 8760 hours.  ProBNP (last 3 results) No results for input(s): PROBNP in the last 8760 hours.    Studies: Ct Abdomen Pelvis Wo Contrast  Result Date:  08/10/2018 CLINICAL DATA:  Abdominal pain. Recent surgery on 08/05/2018. Constipation, abdominal pain and bloating. EXAM: CT ABDOMEN AND PELVIS WITHOUT CONTRAST TECHNIQUE: Multidetector CT imaging of the abdomen and pelvis was performed following the standard protocol without IV contrast. COMPARISON:  None. FINDINGS: Lower chest: No acute abnormality. Hepatobiliary: Hepatic steatosis noted. No focal liver abnormality identified. Gallbladder sludge layering within the dependent portion of the gallbladder noted. No gallbladder wall thickening, pericholecystic fluid or biliary dilatation. Pancreas: Unremarkable. No pancreatic ductal dilatation or surrounding inflammatory changes. Spleen: Normal in size without focal abnormality. Adrenals/Urinary Tract: Normal adrenal glands. Bilateral kidney cysts are identified. These are incompletely characterized without IV contrast. The largest arises from the upper pole of right kidney measuring 9.3 cm. No kidney stones or hydronephrosis identified. No ureterolithiasis or bladder calculi. Stomach/Bowel: Stomach appears normal. The small bowel loops have a normal caliber. The appendix is visualized and appears normal. There is diffuse gaseous distension of the colon all the way to the level of the rectum. No mass identified. Vascular/Lymphatic: Normal appearance of the abdominal aorta. No enlarged lymph nodes within the abdomen or pelvis. No inguinal adenopathy. Reproductive: Prostate is unremarkable. Other: No free fluid or fluid collections. Musculoskeletal: There is asymmetric edema and subcutaneous fat stranding overlying the right hip which may be postoperative. No acute or significant osseous abnormalities identified. IMPRESSION: 1. There is diffuse gaseous distension of the colon up to the level of the rectum. Favor postoperative ileus over distal bowel obstruction. 2. Hepatic steatosis. 3. Bilateral kidney cysts, incompletely characterized without IV contrast 4.  Aortic  Atherosclerosis (ICD10-I70.0). Electronically Signed   By: Kerby Moors M.D.   On: 08/10/2018 17:30   Dg Abd 2 Views  Result Date: 08/12/2018 CLINICAL DATA:  Abdominal pain. EXAM: ABDOMEN - 2 VIEW COMPARISON:  August 10, 2018 FINDINGS: Lung bases are normal. No free air, portal venous gas, or pneumatosis. Colonic distention has decreased with mildly prominent loops remaining on supine imaging. No evidence of small bowel obstruction. No other changes. IMPRESSION: Improving colonic distension, likely improving ileus. Electronically Signed   By: Dorise Bullion III M.D   On: 08/12/2018 12:48   Dg Abd Acute W/chest  Result Date: 08/10/2018 CLINICAL DATA:  Abdominal pain and distension 3-4 days. EXAM: DG ABDOMEN ACUTE W/ 1V CHEST COMPARISON:  None. FINDINGS: Lungs are adequately inflated and otherwise clear. Cardiomediastinal silhouette and remainder of the chest is unremarkable. Abdominopelvic images demonstrate multiple air-filled colonic loops with prominent colonic loop over the midline abdomen on the supine film measuring 13.1 cm which may represent dilated sigmoid colon. Possible dilatation of a small bowel loop over the right mid to upper abdomen measuring 6.1 cm in diameter. There  is no free peritoneal air. Mild degenerate change of the spine. IMPRESSION: Dilated air-filled colonic loop over the midline mid to lower abdomen likely sigmoid colon. Sigmoid volvulus is possible. Possible single dilated small bowel loop over the right mid to upper abdomen. Consider abdominopelvic CT scan for further evaluation. No acute cardiopulmonary disease. Electronically Signed   By: Marin Olp M.D.   On: 08/10/2018 16:47    Scheduled Meds: . acetaminophen  1,000 mg Oral Q8H  . [START ON 08/13/2018] enoxaparin (LOVENOX) injection  0.5 mg/kg Subcutaneous Daily  . ketorolac  15 mg Intravenous Q8H  . potassium chloride  20 mEq Oral BID    Admission status: Inpatient: Based on patients clinical presentation  and evaluation of above clinical data, I have made determination that patient meets Inpatient criteria at this time.  Patient has postop ileus, requiring IV fluids.  Time spent: 25 min  [facility] Pager [phone]. If 7PM-7AM, please contact night-coverage at [email or web address], Office  [phone]  password [email or web address]  08/12/2018, 2:50 PM  LOS: 2 days

## 2018-08-12 NOTE — Progress Notes (Signed)
Initial Nutrition Assessment  DOCUMENTATION CODES:   Obesity unspecified  INTERVENTION:   Diet advancement per MD Will monitor for needs following diet advancement  NUTRITION DIAGNOSIS:   Inadequate oral intake related to inability to eat as evidenced by per patient/family report(CLD).  GOAL:   Patient will meet greater than or equal to 90% of their needs  MONITOR:   PO intake, Diet advancement, Weight trends, Labs, I & O's  REASON FOR ASSESSMENT:   Malnutrition Screening Tool    ASSESSMENT:    67 year old male who underwent total right knee arthroplasty on 08/05/2018 by Dr. Hector Shade. He was discharged 11/27/129.   He returned on 08/10/2018 to the ED feeling bloated, having abdominal pain and no appetite.  He reported 1 bowel movement prior to discharge but none since discharge.   Patient denies any vomiting but some nausea. Currently on clears and tolerating. During previous admission following knee surgery pt was consuming 100% of meals. Once pt discharged 11/27, he only had 1 BM and his intakes began to decrease. Once diet is advanced, will assess if supplements are indicated.  Per weight records, weight is stable.  Labs reviewed. Medications: K-DUR tablet BID, D5 and .9% NaCl w/ KCl infusion at 75 ml/hr  NUTRITION - FOCUSED PHYSICAL EXAM:  Nutrition focused physical exam shows no sign of depletion of muscle mass or body fat.  Diet Order:   Diet Order            Diet clear liquid Room service appropriate? Yes; Fluid consistency: Thin  Diet effective now              EDUCATION NEEDS:   No education needs have been identified at this time  Skin:  Skin Assessment: Skin Integrity Issues: Skin Integrity Issues:: Incisions Incisions: right knee  Last BM:  11/28  Height:   Ht Readings from Last 1 Encounters:  08/10/18 5\' 9"  (1.753 m)    Weight:   Wt Readings from Last 1 Encounters:  08/10/18 99 kg    Ideal Body Weight:  72.7 kg  BMI:  Body  mass index is 32.23 kg/m.  Estimated Nutritional Needs:   Kcal:  1900-2100  Protein:  70-80g  Fluid:  2L/day  Clayton Bibles, MS, RD, LDN [facility] Pager: [phone] After Hours Pager: [phone]

## 2018-08-13 ENCOUNTER — Inpatient Hospital Stay (HOSPITAL_COMMUNITY): Payer: 59

## 2018-08-13 DIAGNOSIS — M7989 Other specified soft tissue disorders: Secondary | ICD-10-CM

## 2018-08-13 DIAGNOSIS — R52 Pain, unspecified: Secondary | ICD-10-CM

## 2018-08-13 LAB — BASIC METABOLIC PANEL
Anion gap: 7 (ref 5–15)
BUN: 13 mg/dL (ref 8–23)
CO2: 25 mmol/L (ref 22–32)
Calcium: 8.2 mg/dL — ABNORMAL LOW (ref 8.9–10.3)
Chloride: 105 mmol/L (ref 98–111)
Creatinine, Ser: 0.82 mg/dL (ref 0.61–1.24)
GFR calc Af Amer: >60 mL/min (ref >60)
GFR calc non Af Amer: >60 mL/min (ref >60)
Glucose, Bld: 105 mg/dL — ABNORMAL HIGH (ref 70–99)
Potassium: 3.6 mmol/L (ref 3.5–5.1)
Sodium: 137 mmol/L (ref 135–145)

## 2018-08-13 MED ORDER — POTASSIUM CHLORIDE CRYS ER 20 MEQ PO TBCR
20.0000 meq | EXTENDED_RELEASE_TABLET | Freq: Two times a day (BID) | ORAL | Status: DC
  Administered 2018-08-13 – 2018-08-14 (×3): 20 meq via ORAL
  Filled 2018-08-13 (×3): qty 1

## 2018-08-13 MED ORDER — POTASSIUM CHLORIDE IN NACL 20-0.9 MEQ/L-% IV SOLN
INTRAVENOUS | Status: DC
  Administered 2018-08-13: 22:00:00 via INTRAVENOUS
  Filled 2018-08-13: qty 1000

## 2018-08-13 MED ORDER — SODIUM CHLORIDE 0.9 % IV SOLN: INTRAVENOUS | Status: DC

## 2018-08-13 NOTE — Progress Notes (Signed)
Right lower extremity venous duplex has been completed. Negative for DVT.  08/13/18 2:37 PM Marcus Greer RVT

## 2018-08-13 NOTE — Progress Notes (Signed)
Patient ID: Marcus Greer, male   DOB: 12-04-1950, 67 y.o.   MRN: [id number]       Subjective: Patient continues to feel better.  Feels a little less distended even than yesterday.  Passing lots of flatus.  No BM.  Tolerating sips of liquids.  Mobilizing.  Getting more kdur today.  Objective: Vital signs in last 24 hours: Temp:  [98.2 F (36.8 C)-99.3 F (37.4 C)] 99.3 F (37.4 C) (12/03 0900) Pulse Rate:  [66-80] 66 (12/03 0900) Resp:  [16-20] 16 (12/03 0900) BP: (124-145)/(76-89) 124/77 (12/03 0900) SpO2:  [98 %-99 %] 98 % (12/03 0900) Last BM Date: 08/08/18  Intake/Output from previous day: 12/02 0701 - 12/03 0700 In: 3253.3 [P.O.:1510; I.V.:1743.3] Out: 2450 [Urine:2450] Intake/Output this shift: Total I/O In: 145 [P.O.:120; I.V.:25] Out: 425 [Urine:425]  PE: Heart: regular Lungs: CTAB Abd: soft, some distention, +BS, NT  Lab Results:  Recent Labs    08/10/18 1528  WBC 12.0*  HGB 11.9*  HCT 36.6*  PLT 222   BMET Recent Labs    08/12/18 0416 08/13/18 0449  NA 140 137  K 3.6 3.6  CL 106 105  CO2 26 25  GLUCOSE 116* 105*  BUN 16 13  CREATININE 0.98 0.82  CALCIUM 8.1* 8.2*   PT/INR No results for input(s): LABPROT, INR in the last 72 hours. CMP     Component Value Date/Time   NA 137 08/13/2018 0449   K 3.6 08/13/2018 0449   CL 105 08/13/2018 0449   CO2 25 08/13/2018 0449   GLUCOSE 105 (H) 08/13/2018 0449   BUN 13 08/13/2018 0449   CREATININE 0.82 08/13/2018 0449   CALCIUM 8.2 (L) 08/13/2018 0449   PROT 6.0 (L) 08/12/2018 0416   PROT 6.9 10/17/2016 0838   ALBUMIN 2.8 (L) 08/12/2018 0416   ALBUMIN 4.6 10/17/2016 0838   AST 28 08/12/2018 0416   ALT 33 08/12/2018 0416   ALKPHOS 44 08/12/2018 0416   BILITOT 1.3 (H) 08/12/2018 0416   BILITOT 0.5 10/17/2016 0838   GFRNONAA >60 08/13/2018 0449   GFRAA >60 08/13/2018 0449   Lipase     Component Value Date/Time   LIPASE 33 08/10/2018 1528       Studies/Results: Dg Abd 2 Views  Result  Date: 08/12/2018 CLINICAL DATA:  Abdominal pain. EXAM: ABDOMEN - 2 VIEW COMPARISON:  August 10, 2018 FINDINGS: Lung bases are normal. No free air, portal venous gas, or pneumatosis. Colonic distention has decreased with mildly prominent loops remaining on supine imaging. No evidence of small bowel obstruction. No other changes. IMPRESSION: Improving colonic distension, likely improving ileus. Electronically Signed   By: Dorise Bullion III M.D   On: 08/12/2018 12:48    Anti-infectives: Anti-infectives (From admission, onward)   None       Assessment/Plan Total right knee arthroplasty 08/05/2018 CAD history of stent Hyperlipidemia Hx GERD  Ileus, s/p RKA -patient seems to be improving.  His films yesterday show quite a bit of improvement in colonic distention from several days ago.  He is passing flatus and feels less distended.   -clear liquids and his diet can likely be advanced as tolerated.  We discussed that it will likely take a while for his abdomen to feel like it "normally" does, but should get better with time. -cont to replace K to >4. -minimize narcotics.  He is only using 1 tramadol q 6h -mobilize -no further surgical needs.  Will defer further care to the primary medical team.  FEN -  CLD VTE - Lovenox ID - none   LOS: 3 days    Henreitta Cea , [facility] 08/13/2018, 12:49 PM Pager: [phone]

## 2018-08-13 NOTE — Progress Notes (Signed)
[facility]  PROGRESS NOTE  Marcus Greer KXF:[id number] DOB: 01-Jun-1951 DOA: 08/10/2018 PCP: Lawerance Cruel, MD   Brief HPI:   67 year old male with a history of CAD status post PCI with stent in 2004, hypertension, osteoarthritis status post right total knee arthroplasty 08/05/2018 came with abdominal pain.  CT of the abdomen showed findings suggestive of ileus.  Patient not vomiting so no NG tube was inserted.    Subjective   Patient seen and examined, passing flatus.  Still no BM.   Assessment/Plan:     1. Postop ileus-secondary to opiates, patient was discharged on opioid medications after he had total knee arthroplasty.  Opioids are currently on hold.  MiraLAX was discontinued, continue Dulcolax 10 mg per rectum as needed.  General surgery following.  No surgical intervention.  Abdomen x-ray still shows ileus.  2. Status post right total knee arthroplasty-POD #5, patient is weightbearing as tolerated.  Continue Lovenox for DVT prophylaxis.  3. Hyponatremia-resolved, sodium is 135.  4. Hypokalemia- replaced, goal is to keep potassium around 4.  Patient is on normal saline 20 mg KCl along with p.o. K. Dur 20 mg p.o. twice daily.  Will give 4 more doses.  Check BMP in a.m.  5. Hyperbilirubinemia-bilirubin mildly elevated 1.6, improved from 1.7 yesterday.  AST ALT are within normal limits.  Unclear etiology.  No abnormality seen on CT scan.  Will continue to follow LFTs in the hospital.     CBC: Recent Labs  Lab 08/07/18 0517 08/10/18 1528  WBC 21.7* 12.0*  NEUTROABS  --  9.3*  HGB 13.7 11.9*  HCT 41.6 36.6*  MCV 95.6 93.8  PLT 183 696    Basic Metabolic Panel: Recent Labs  Lab 08/07/18 0517 08/10/18 1528 08/11/18 0354 08/12/18 0416 08/13/18 0449  NA 138 130* 135 140 137  K 4.4 3.5 3.3* 3.6 3.6  CL 105 98 101 106 105  CO2 28 23 28 26 25   GLUCOSE 122* 120* 105* 116* 105*  BUN 21 19 19 16 13   CREATININE 1.13 0.90 0.92 0.98 0.82  CALCIUM 8.5*  8.1* 8.1* 8.1* 8.2*  MG  --   --   --  2.4  --      DVT prophylaxis: Lovenox  Code Status: Full code  Family Communication: No family at bedside  Disposition Plan: likely home when medically ready for discharge   Consultants:    Procedures:     Antibiotics:   Anti-infectives (From admission, onward)   None       Objective   Vitals:   08/12/18 1456 08/12/18 2106 08/13/18 0451 08/13/18 0900  BP: 133/76 (!) 145/89 (!) 141/84 124/77  Pulse: 78 80 69 66  Resp: 17 20 20 16   Temp: 98.8 F (37.1 C) 99.1 F (37.3 C) 98.2 F (36.8 C) 99.3 F (37.4 C)  TempSrc: Oral Oral Oral Oral  SpO2: 99% 99% 99% 98%  Weight:      Height:        Intake/Output Summary (Last 24 hours) at 08/13/2018 1208 Last data filed at 08/13/2018 1000 Gross per 24 hour  Intake 2398.38 ml  Output 2250 ml  Net 148.38 ml   Filed Weights   08/10/18 1411  Weight: 99 kg     Physical Examination:     General: Appears in no acute distress  Cardiovascular: S1-S2, regular  Respiratory: Clear to auscultation bilaterally  Abdomen: Soft, nontender, mildly distended,  no organomegaly  Musculoskeletal: No edema in the lower extremities  Data Reviewed: I have personally reviewed following labs and imaging studies   No results found for this or any previous visit (from the past 240 hour(s)).   Liver Function Tests: Recent Labs  Lab 08/10/18 1528 08/11/18 0354 08/12/18 0416  AST 34 27 28  ALT 34 30 33  ALKPHOS 43 44 44  BILITOT 1.7* 1.6* 1.3*  PROT 6.5 6.0* 6.0*  ALBUMIN 3.2* 3.1* 2.8*   Recent Labs  Lab 08/10/18 1528  LIPASE 33      Studies: Dg Abd 2 Views  Result Date: 08/12/2018 CLINICAL DATA:  Abdominal pain. EXAM: ABDOMEN - 2 VIEW COMPARISON:  August 10, 2018 FINDINGS: Lung bases are normal. No free air, portal venous gas, or pneumatosis. Colonic distention has decreased with mildly prominent loops remaining on supine imaging. No evidence of small bowel  obstruction. No other changes. IMPRESSION: Improving colonic distension, likely improving ileus. Electronically Signed   By: Dorise Bullion III M.D   On: 08/12/2018 12:48    Scheduled Meds: . acetaminophen  1,000 mg Oral Q8H  . enoxaparin (LOVENOX) injection  0.5 mg/kg Subcutaneous Daily  . ketorolac  15 mg Intravenous Q8H  . potassium chloride  20 mEq Oral BID    Admission status: Inpatient: Based on patients clinical presentation and evaluation of above clinical data, I have made determination that patient meets Inpatient criteria at this time.  Patient has postop ileus, requiring IV fluids.  Time spent: 25 min  [facility] Pager [phone]. If 7PM-7AM, please contact night-coverage at [email or web address], Office  [phone]  password [email or web address]  08/13/2018, 12:08 PM  LOS: 3 days

## 2018-08-13 NOTE — Progress Notes (Signed)
PT Cancellation Note  Patient Details Name: Marcus Greer MRN: [id number] DOB: 1951-03-28   Cancelled Treatment:    Reason Eval/Treat Not Completed: Patient not medically ready - Pt with redness, warmth, tenderness along posterior and lateral calf, swelling along RLE especially along the lower leg, and pitting edema around his medial R ankle and just below R knee. Due to recent history of R TKR and aforementioned signs/symptoms, PT notified RN in concern for potential DVT. RN in room with pt when PT left. PT to check back when appropriate.   Julien Girt, PT Acute Rehabilitation Services Pager [phone]  Office [phone]   [place] 08/13/2018, 2:03 PM

## 2018-08-13 NOTE — Progress Notes (Signed)
Paged Dr Wynelle Link to notify him of pitting edema in right leg and bruising. Christy the PA was notified and coming to check on Marcus Greer. I will continue to monitor.

## 2018-08-13 NOTE — Progress Notes (Signed)
Called by RN that patient developed swelling and redness of right lower extremity which is new from yesterday.  Patient recently had total knee replacement on the right. Patient has been getting Lovenox for DVT prophylaxis during this hospitalization Will obtain venous duplex of right lower extremity to rule out DVT

## 2018-08-14 LAB — COMPREHENSIVE METABOLIC PANEL
ALT: 47 U/L — ABNORMAL HIGH (ref 0–44)
AST: 44 U/L — ABNORMAL HIGH (ref 15–41)
Albumin: 3.1 g/dL — ABNORMAL LOW (ref 3.5–5.0)
Alkaline Phosphatase: 49 U/L (ref 38–126)
Anion gap: 10 (ref 5–15)
BILIRUBIN TOTAL: 1.6 mg/dL — AB (ref 0.3–1.2)
BUN: 14 mg/dL (ref 8–23)
CO2: 21 mmol/L — ABNORMAL LOW (ref 22–32)
Calcium: 8.6 mg/dL — ABNORMAL LOW (ref 8.9–10.3)
Chloride: 105 mmol/L (ref 98–111)
Creatinine, Ser: 0.93 mg/dL (ref 0.61–1.24)
GFR calc Af Amer: >60 mL/min (ref >60)
Glucose, Bld: 96 mg/dL (ref 70–99)
Potassium: 4.2 mmol/L (ref 3.5–5.1)
Sodium: 136 mmol/L (ref 135–145)
TOTAL PROTEIN: 6.2 g/dL — AB (ref 6.5–8.1)

## 2018-08-14 MED ORDER — POLYETHYLENE GLYCOL 3350 17 G PO PACK: 17.0000 g | PACK | Freq: Every day | ORAL | 0 refills | Status: DC | PRN

## 2018-08-14 MED ORDER — BISACODYL 10 MG RE SUPP: 10.0000 mg | Freq: Every day | RECTAL | 0 refills | Status: DC | PRN

## 2018-08-14 MED ORDER — ATORVASTATIN CALCIUM 20 MG PO TABS: 20.0000 mg | ORAL_TABLET | Freq: Every day | ORAL | Status: DC

## 2018-08-14 MED ORDER — SENNA 8.6 MG PO TABS: 1.0000 | ORAL_TABLET | Freq: Two times a day (BID) | ORAL | 0 refills | Status: DC

## 2018-08-14 NOTE — Progress Notes (Signed)
Physical Therapy Treatment Patient Details Name: Marcus Greer MRN: [id number] DOB: [birth year] Today's Date: 08/14/2018    History of Present Illness Pt admitted 08/10/18 for post-op ileus, s/p R TKR on 08/05/18. PMH includes CAD s/p PCI with stent in 2004, basal cell carcinoma, DJD, GERD, HLD, HTN, DOE, shoulder arthroscopy, heart cath.     PT Comments    Pt with improved gait this session, and performed LE exercises well. Pt still has significant R knee flexion ROM deficits at this time, has OPPT appointment scheduled on Friday. Pt to d/c home today.   Follow Up Recommendations  Follow surgeon's recommendation for DC plan and follow-up therapies;Supervision for mobility/OOB     Equipment Recommendations  None recommended by PT    Recommendations for Other Services       Precautions / Restrictions Precautions Precautions: Fall;Knee Restrictions Weight Bearing Restrictions: No Other Position/Activity Restrictions: WBAT    Mobility  Bed Mobility               General bed mobility comments: Pt up in chair upon PT arrival   Transfers Overall transfer level: Modified independent Equipment used: Rolling walker (2 wheeled) Transfers: Sit to/from Stand Sit to Stand: Modified independent (Device/Increase time)         General transfer comment: Mod I for increased time and effort. Pt does not require physical assist to come to standing.   Ambulation/Gait Ambulation/Gait assistance: Supervision Gait Distance (Feet): 350 Feet Assistive device: Rolling walker (2 wheeled) Gait Pattern/deviations: Step-through pattern;Antalgic;Decreased stance time - right;Decreased weight shift to right Gait velocity: normal   General Gait Details: Pt with more normalized gait this session, progressing to step through gait and heel-toe ambulation with RLE. Pt still limited by pain.    Stairs         General stair comments: Pt deferred stair training, states he feels proficient  in stair navigation and has assist from wife and neighbors if needed. Pt reports that he recalls to go "up with the good, down with the bad", and he uses handrail and HHA for stair navigation at home.   Wheelchair Mobility    Modified Rankin (Stroke Patients Only)       Balance Overall balance assessment: Mild deficits observed, not formally tested                                          Cognition Arousal/Alertness: Awake/alert Behavior During Therapy: WFL for tasks assessed/performed Overall Cognitive Status: Within Functional Limits for tasks assessed                                        Exercises Total Joint Exercises Quad Sets: AROM;5 reps;Right;Seated Short Arc Quad: 10 reps;Right;AROM;Seated Heel Slides: 10 reps;Right;AAROM;Seated Hip ABduction/ADduction: Right;AROM;Seated;15 reps Straight Leg Raises: Right;10 reps;AROM;Seated Knee Flexion: AAROM;Right;10 reps;Seated(with use of towel under foot to assist in sliding ) Goniometric ROM: knee AAROM ~5-60*, limited by pain and stiffness    General Comments General comments (skin integrity, edema, etc.): Dr. Wynelle Greer checked on pt this am, and found swelling of RLE to be WNL for post-op TKR. Dr. Wynelle Greer has no concern for DVT or infection at this time.       Pertinent Vitals/Pain Pain Assessment: 0-10 Pain Score: 3  Pain Location: R knee, with changing positions  Pain Descriptors / Indicators: Sore Pain Intervention(s): Limited activity within patient's tolerance;Repositioned;Monitored during session;Ice applied    Home Living                      Prior Function            PT Goals (current goals can now be found in the care plan section) Acute Rehab PT Goals Patient Stated Goal: go home  PT Goal Formulation: With patient Time For Goal Achievement: 08/26/18 Potential to Achieve Goals: Good Progress towards PT goals: Progressing toward goals    Frequency    Min  3X/week      PT Plan Current plan remains appropriate    Co-evaluation              AM-PAC PT "6 Clicks" Mobility   Outcome Measure  Help needed turning from your back to your side while in a flat bed without using bedrails?: None Help needed moving from lying on your back to sitting on the side of a flat bed without using bedrails?: None Help needed moving to and from a bed to a chair (including a wheelchair)?: None Help needed standing up from a chair using your arms (e.g., wheelchair or bedside chair)?: None Help needed to walk in hospital room?: A Little Help needed climbing 3-5 steps with a railing? : A Little 6 Click Score: 22    End of Session   Activity Tolerance: Patient tolerated treatment well Patient left: with call bell/phone within reach;with family/visitor present;in chair Nurse Communication: Mobility status PT Visit Diagnosis: Other abnormalities of gait and mobility (R26.89);Difficulty in walking, not elsewhere classified (R26.2)     Time: 4492-0100 PT Time Calculation (min) (ACUTE ONLY): 27 min  Charges:  $Gait Training: 8-22 mins $Therapeutic Exercise: 8-22 mins                     Marcus Greer, PT Acute Rehabilitation Services Pager [phone]  Office [phone]   Marcus Greer [name] 08/14/2018, 12:06 PM

## 2018-08-14 NOTE — Discharge Summary (Signed)
Physician Discharge Summary  Marcus Greer OPF:[id number] DOB: [birth year] DOA: 08/10/2018  PCP: Lawerance Cruel, MD  Admit date: 08/10/2018 Discharge date: 08/14/2018  Admitted From: Home Disposition:  Home  Recommendations for Outpatient Follow-up:  1. Follow up with PCP in 1 week 2. Outpatient follow-up with orthopedics 3. Wound care as per orthopedics recommendations 4. Activity as per orthopedics recommendations   Home Health: No Equipment/Devices: None Discharge Condition: Stable CODE STATUS: Full Diet recommendation: Heart Healthy /soft diet  Brief/Interim Summary: 67 year old male with history of CAD status post PCI with stenting 2004, hypertension, osteoarthritis status post right total knee arthroplasty on 08/05/2018 came with abdominal pain.  CT of the abdomen showed findings suggestive of ileus.  General surgery was consulted.  Condition has improved.  He is tolerating diet.  He had bowel movement today.  General surgery has signed off.  Patient will be discharged home with outpatient follow-up with orthopedics.  He is tolerating PT.  Discharge Diagnoses:  Active Problems:   Coronary artery disease   Postoperative ileus (HCC)   Hyponatremia   Essential hypertension  Postoperative ileus -Most likely secondary to opiates -Opiates held -General surgery evaluated and followed the patient.  General surgery has signed off. -Patient is tolerating clear liquid diet.  Diet was advanced to soft diet.  Patient had bowel movement today.  He will be discharged home on bowel regimen.  Opiates will remain on hold.  Status post right total knee arthroplasty on 08/05/2018 -Weightbearing as tolerated.  Wound care as per orthopedics recommendations.  DVT prophylaxis will be the same as per recent discharge recommendations from orthopedics including Xarelto followed by aspirin.  Outpatient follow-up with orthopedics  Hyponatremia -Resolved    Discharge  Instructions  Discharge Instructions    Call MD for:  difficulty breathing, headache or visual disturbances   Complete by:  As directed    Call MD for:  extreme fatigue   Complete by:  As directed    Call MD for:  hives   Complete by:  As directed    Call MD for:  persistant dizziness or light-headedness   Complete by:  As directed    Call MD for:  persistant nausea and vomiting   Complete by:  As directed    Call MD for:  redness, tenderness, or signs of infection (pain, swelling, redness, odor or green/yellow discharge around incision site)   Complete by:  As directed    Call MD for:  severe uncontrolled pain   Complete by:  As directed    Call MD for:  temperature >100.4   Complete by:  As directed    Diet - low sodium heart healthy   Complete by:  As directed    Discharge instructions   Complete by:  As directed    Soft diet   Increase activity slowly   Complete by:  As directed      Discharge Instructions   None    Allergies as of 08/14/2018      Reactions   Gemfibrozil Other (See Comments)   Unknown   Isovue [iopamidol] Hives   Pt reports that he was told after having a heart catherization that he was allergic to the contrast used, states he had hives   Niaspan [niacin Er] Other (See Comments)   Unknown   Tape Other (See Comments)   Irritation      Medication List    STOP taking these medications   oxyCODONE 5 MG immediate release tablet Commonly known  as:  Oxy IR/ROXICODONE     TAKE these medications   atorvastatin 20 MG tablet Commonly known as:  LIPITOR Take 1 tablet (20 mg total) by mouth at bedtime.   bisacodyl 10 MG suppository Commonly known as:  DULCOLAX Place 1 suppository (10 mg total) rectally daily as needed for moderate constipation.   methocarbamol 500 MG tablet Commonly known as:  ROBAXIN Take 1 tablet (500 mg total) by mouth every 6 (six) hours as needed for muscle spasms.   polyethylene glycol packet Commonly known as:  MIRALAX /  GLYCOLAX Take 17 g by mouth daily as needed for moderate constipation.   ramipril 5 MG capsule Commonly known as:  ALTACE TAKE ONE CAPSULE BY MOUTH DAILY   rivaroxaban 10 MG Tabs tablet Commonly known as:  XARELTO Take 1 tablet (10 mg total) by mouth daily with breakfast for 19 days. Then resume one 81 mg aspirin once a day.   senna 8.6 MG Tabs tablet Commonly known as:  SENOKOT Take 1 tablet (8.6 mg total) by mouth 2 (two) times daily.   traMADol 50 MG tablet Commonly known as:  ULTRAM Take 1-2 tablets (50-100 mg total) by mouth every 6 (six) hours as needed for moderate pain.       Follow-up Information    Lawerance Cruel, MD. Schedule an appointment as soon as possible for a visit in 1 week(s).   Specialty:  Family Medicine Contact information: [place] [address] [phone]        Sueanne Margarita, MD .   Specialty:  Cardiology Contact information: [phone] [address] [place] [address] [phone]        Gaynelle Arabian, MD. Schedule an appointment as soon as possible for a visit in 1 week(s).   Specialty:  Orthopedic Surgery Contact information: [address] [place] [address] [place] [address] [phone]          Allergies  Allergen Reactions  . Gemfibrozil Other (See Comments)    Unknown  . Isovue [Iopamidol] Hives    Pt reports that he was told after having a heart catherization that he was allergic to the contrast used, states he had hives  . Niaspan [Niacin Er] Other (See Comments)    Unknown  . Tape Other (See Comments)    Irritation    Consultations: Orthopedics/general surgery  Procedures/Studies: Ct Abdomen Pelvis Wo Contrast  Result Date: 08/10/2018 CLINICAL DATA:  Abdominal pain. Recent surgery on 08/05/2018. Constipation, abdominal pain and bloating. EXAM: CT ABDOMEN AND PELVIS WITHOUT CONTRAST TECHNIQUE: Multidetector CT imaging of the abdomen and pelvis was performed following the  standard protocol without IV contrast. COMPARISON:  None. FINDINGS: Lower chest: No acute abnormality. Hepatobiliary: Hepatic steatosis noted. No focal liver abnormality identified. Gallbladder sludge layering within the dependent portion of the gallbladder noted. No gallbladder wall thickening, pericholecystic fluid or biliary dilatation. Pancreas: Unremarkable. No pancreatic ductal dilatation or surrounding inflammatory changes. Spleen: Normal in size without focal abnormality. Adrenals/Urinary Tract: Normal adrenal glands. Bilateral kidney cysts are identified. These are incompletely characterized without IV contrast. The largest arises from the upper pole of right kidney measuring 9.3 cm. No kidney stones or hydronephrosis identified. No ureterolithiasis or bladder calculi. Stomach/Bowel: Stomach appears normal. The small bowel loops have a normal caliber. The appendix is visualized and appears normal. There is diffuse gaseous distension of the colon all the way to the level of the rectum. No mass identified. Vascular/Lymphatic: Normal appearance of the abdominal aorta.  No enlarged lymph nodes within the abdomen or pelvis. No inguinal adenopathy. Reproductive: Prostate is unremarkable. Other: No free fluid or fluid collections. Musculoskeletal: There is asymmetric edema and subcutaneous fat stranding overlying the right hip which may be postoperative. No acute or significant osseous abnormalities identified. IMPRESSION: 1. There is diffuse gaseous distension of the colon up to the level of the rectum. Favor postoperative ileus over distal bowel obstruction. 2. Hepatic steatosis. 3. Bilateral kidney cysts, incompletely characterized without IV contrast 4.  Aortic Atherosclerosis (ICD10-I70.0). Electronically Signed   By: Kerby Moors M.D.   On: 08/10/2018 17:30   Dg Abd 2 Views  Result Date: 08/12/2018 CLINICAL DATA:  Abdominal pain. EXAM: ABDOMEN - 2 VIEW COMPARISON:  August 10, 2018 FINDINGS: Lung bases  are normal. No free air, portal venous gas, or pneumatosis. Colonic distention has decreased with mildly prominent loops remaining on supine imaging. No evidence of small bowel obstruction. No other changes. IMPRESSION: Improving colonic distension, likely improving ileus. Electronically Signed   By: Dorise Bullion III M.D   On: 08/12/2018 12:48   Dg Abd Acute W/chest  Result Date: 08/10/2018 CLINICAL DATA:  Abdominal pain and distension 3-4 days. EXAM: DG ABDOMEN ACUTE W/ 1V CHEST COMPARISON:  None. FINDINGS: Lungs are adequately inflated and otherwise clear. Cardiomediastinal silhouette and remainder of the chest is unremarkable. Abdominopelvic images demonstrate multiple air-filled colonic loops with prominent colonic loop over the midline abdomen on the supine film measuring 13.1 cm which may represent dilated sigmoid colon. Possible dilatation of a small bowel loop over the right mid to upper abdomen measuring 6.1 cm in diameter. There is no free peritoneal air. Mild degenerate change of the spine. IMPRESSION: Dilated air-filled colonic loop over the midline mid to lower abdomen likely sigmoid colon. Sigmoid volvulus is possible. Possible single dilated small bowel loop over the right mid to upper abdomen. Consider abdominopelvic CT scan for further evaluation. No acute cardiopulmonary disease. Electronically Signed   By: Marin Olp M.D.   On: 08/10/2018 16:47   Vas [place] Lower Extremity Venous (dvt)  Result Date: 08/13/2018  Lower Venous Study Indications: Swelling, and Pain.  Performing Technologist: Oliver Hum RVT  Examination Guidelines: A complete evaluation includes B-mode imaging, spectral Doppler, color Doppler, and power Doppler as needed of all accessible portions of each vessel. Bilateral testing is considered an integral part of a complete examination. Limited examinations for reoccurring indications may be performed as noted.  Right Venous Findings:  +---------+---------------+---------+-----------+----------+-------+          CompressibilityPhasicitySpontaneityPropertiesSummary +---------+---------------+---------+-----------+----------+-------+ CFV      Full           Yes      Yes                          +---------+---------------+---------+-----------+----------+-------+ SFJ      Full                                                 +---------+---------------+---------+-----------+----------+-------+ FV Prox  Full                                                 +---------+---------------+---------+-----------+----------+-------+ FV Mid   Full                                                 +---------+---------------+---------+-----------+----------+-------+  FV DistalFull                                                 +---------+---------------+---------+-----------+----------+-------+ PFV      Full                                                 +---------+---------------+---------+-----------+----------+-------+ POP      Full           Yes      Yes                          +---------+---------------+---------+-----------+----------+-------+ PTV      Full                                                 +---------+---------------+---------+-----------+----------+-------+ PERO     Full                                                 +---------+---------------+---------+-----------+----------+-------+  Left Venous Findings: +---+---------------+---------+-----------+----------+-------+    CompressibilityPhasicitySpontaneityPropertiesSummary +---+---------------+---------+-----------+----------+-------+ CFVFull           Yes      Yes                          +---+---------------+---------+-----------+----------+-------+    Summary: Right: There is no evidence of deep vein thrombosis in the lower extremity. No cystic structure found in the popliteal fossa. Left: No evidence of  common femoral vein obstruction.  *See table(s) above for measurements and observations. Electronically signed by Servando Snare MD on 08/13/2018 at 3:28:34 PM.    Final      Subjective: Patient seen and examined at bedside.  He is tolerating diet.  No overnight worsening abdominal pain or fever.  He is passing gas.  Discharge Exam: Vitals:   08/13/18 2202 08/14/18 0535  BP: (!) 141/89 (!) 142/83  Pulse: 70 71  Resp: 15 14  Temp: 98.6 F (37 C) 98.9 F (37.2 C)  SpO2: 97% 98%   Vitals:   08/13/18 0900 08/13/18 1449 08/13/18 2202 08/14/18 0535  BP: 124/77 129/89 (!) 141/89 (!) 142/83  Pulse: 66 74 70 71  Resp: 16 16 15 14   Temp: 99.3 F (37.4 C) 98.5 F (36.9 C) 98.6 F (37 C) 98.9 F (37.2 C)  TempSrc: Oral Oral Oral Oral  SpO2: 98% 98% 97% 98%  Weight:      Height:        General: Pt is alert, awake, not in acute distress Cardiovascular: rate controlled, S1/S2 + Respiratory: bilateral decreased breath sounds at bases Abdominal: Soft, NT, ND, bowel sounds + Extremities: Mild edema in right lower extremity, no cyanosis    The results of significant diagnostics from this hospitalization (including imaging, microbiology, ancillary and laboratory) are listed below for reference.     Microbiology: No results found for this or any previous visit (from the  past 240 hour(s)).   Labs: BNP (last 3 results) No results for input(s): BNP in the last 8760 hours. Basic Metabolic Panel: Recent Labs  Lab 08/10/18 1528 08/11/18 0354 08/12/18 0416 08/13/18 0449 08/14/18 0409  NA 130* 135 140 137 136  K 3.5 3.3* 3.6 3.6 4.2  CL 98 101 106 105 105  CO2 23 28 26 25  21*  GLUCOSE 120* 105* 116* 105* 96  BUN 19 19 16 13 14   CREATININE 0.90 0.92 0.98 0.82 0.93  CALCIUM 8.1* 8.1* 8.1* 8.2* 8.6*  MG  --   --  2.4  --   --    Liver Function Tests: Recent Labs  Lab 08/10/18 1528 08/11/18 0354 08/12/18 0416 08/14/18 0409  AST 34 27 28 44*  ALT 34 30 33 47*  ALKPHOS 43 44  44 49  BILITOT 1.7* 1.6* 1.3* 1.6*  PROT 6.5 6.0* 6.0* 6.2*  ALBUMIN 3.2* 3.1* 2.8* 3.1*   Recent Labs  Lab 08/10/18 1528  LIPASE 33   No results for input(s): AMMONIA in the last 168 hours. CBC: Recent Labs  Lab 08/10/18 1528  WBC 12.0*  NEUTROABS 9.3*  HGB 11.9*  HCT 36.6*  MCV 93.8  PLT 222   Cardiac Enzymes: No results for input(s): CKTOTAL, CKMB, CKMBINDEX, TROPONINI in the last 168 hours. BNP: Invalid input(s): POCBNP CBG: No results for input(s): GLUCAP in the last 168 hours. D-Dimer No results for input(s): DDIMER in the last 72 hours. Hgb A1c No results for input(s): HGBA1C in the last 72 hours. Lipid Profile No results for input(s): CHOL, HDL, LDLCALC, TRIG, CHOLHDL, LDLDIRECT in the last 72 hours. Thyroid function studies No results for input(s): TSH, T4TOTAL, T3FREE, THYROIDAB in the last 72 hours.  Invalid input(s): FREET3 Anemia work up No results for input(s): VITAMINB12, FOLATE, FERRITIN, TIBC, IRON, RETICCTPCT in the last 72 hours. Urinalysis No results found for: COLORURINE, APPEARANCEUR, LABSPEC, [place], GLUCOSEU, HGBUR, BILIRUBINUR, KETONESUR, PROTEINUR, UROBILINOGEN, NITRITE, LEUKOCYTESUR Sepsis Labs Invalid input(s): PROCALCITONIN,  WBC,  LACTICIDVEN Microbiology No results found for this or any previous visit (from the past 240 hour(s)).   Time coordinating discharge: 35 minutes  SIGNED:   Aline August, MD  [facility] 08/14/2018, 12:59 PM Pager: [phone]  If 7PM-7AM, please contact night-coverage [email or web address] Password [email or web address]

## 2018-08-14 NOTE — Progress Notes (Signed)
Subjective: Asked to see patient regarding RLE swelling. Developed yesterday after increased activity. No complaints of calf pain. Had negative doppler yesterday   Objective: Vital signs in last 24 hours: Temp:  [98.5 F (36.9 C)-99.3 F (37.4 C)] 98.9 F (37.2 C) (12/04 0535) Pulse Rate:  [66-74] 71 (12/04 0535) Resp:  [14-16] 14 (12/04 0535) BP: (124-142)/(77-89) 142/83 (12/04 0535) SpO2:  [97 %-98 %] 98 % (12/04 0535)  Intake/Output from previous day: 12/03 0701 - 12/04 0700 In: 1443.8 [P.O.:830; I.V.:613.8] Out: 2125 [Urine:2125] Intake/Output this shift: Total I/O In: 530 [P.O.:530] Out: 1050 [Urine:1050]  No results for input(s): HGB in the last 72 hours. No results for input(s): WBC, RBC, HCT, PLT in the last 72 hours. Recent Labs    08/13/18 0449 08/14/18 0409  NA 137 136  K 3.6 4.2  CL 105 105  CO2 25 21*  BUN 13 14  CREATININE 0.82 0.93  GLUCOSE 105* 96  CALCIUM 8.2* 8.6*   No results for input(s): LABPT, INR in the last 72 hours.  RLE with mild/moderate swelling; has bruising at ankle; no evidence of cellulitis    Assessment/Plan: S/p right TKA- I see no evidence of infection in his knee or cellulitis. This is normal amount of dependent edema at 1 1/2 weeks post-op from a knee replacement. Encouraged icing and elevation. May resume PT   Gaynelle Arabian 08/14/2018, 6:56 AM

## 2018-08-14 NOTE — Progress Notes (Signed)
Pt was discharged home today. Instructions were reviewed with patient, and questions were answered. Pt was taken to main entrance via wheelchair by NT.  

## 2018-08-16 DIAGNOSIS — M25561 Pain in right knee: Secondary | ICD-10-CM | POA: Diagnosis not present

## 2018-08-19 DIAGNOSIS — M25561 Pain in right knee: Secondary | ICD-10-CM | POA: Diagnosis not present

## 2018-08-21 DIAGNOSIS — M25561 Pain in right knee: Secondary | ICD-10-CM | POA: Diagnosis not present

## 2018-08-23 DIAGNOSIS — M25561 Pain in right knee: Secondary | ICD-10-CM | POA: Diagnosis not present

## 2018-08-26 DIAGNOSIS — M25561 Pain in right knee: Secondary | ICD-10-CM | POA: Diagnosis not present

## 2018-08-28 DIAGNOSIS — M25561 Pain in right knee: Secondary | ICD-10-CM | POA: Diagnosis not present

## 2018-08-30 DIAGNOSIS — M25561 Pain in right knee: Secondary | ICD-10-CM | POA: Diagnosis not present

## 2018-09-10 DIAGNOSIS — M25561 Pain in right knee: Secondary | ICD-10-CM | POA: Diagnosis not present

## 2018-09-12 DIAGNOSIS — Z471 Aftercare following joint replacement surgery: Secondary | ICD-10-CM | POA: Diagnosis not present

## 2018-09-12 DIAGNOSIS — Z96651 Presence of right artificial knee joint: Secondary | ICD-10-CM | POA: Diagnosis not present

## 2018-09-13 DIAGNOSIS — M25561 Pain in right knee: Secondary | ICD-10-CM | POA: Diagnosis not present

## 2018-09-16 DIAGNOSIS — M25561 Pain in right knee: Secondary | ICD-10-CM | POA: Diagnosis not present

## 2018-09-20 DIAGNOSIS — M25561 Pain in right knee: Secondary | ICD-10-CM | POA: Diagnosis not present

## 2018-09-23 DIAGNOSIS — M25561 Pain in right knee: Secondary | ICD-10-CM | POA: Diagnosis not present

## 2018-09-26 DIAGNOSIS — M25561 Pain in right knee: Secondary | ICD-10-CM | POA: Diagnosis not present

## 2018-09-30 DIAGNOSIS — M25561 Pain in right knee: Secondary | ICD-10-CM | POA: Diagnosis not present

## 2018-10-01 DIAGNOSIS — Z23 Encounter for immunization: Secondary | ICD-10-CM | POA: Diagnosis not present

## 2018-10-03 DIAGNOSIS — M25561 Pain in right knee: Secondary | ICD-10-CM | POA: Diagnosis not present

## 2018-10-04 DIAGNOSIS — M65312 Trigger thumb, left thumb: Secondary | ICD-10-CM | POA: Diagnosis not present

## 2018-10-11 DIAGNOSIS — M25561 Pain in right knee: Secondary | ICD-10-CM | POA: Diagnosis not present

## 2018-10-18 DIAGNOSIS — M25561 Pain in right knee: Secondary | ICD-10-CM | POA: Diagnosis not present

## 2018-11-21 DIAGNOSIS — L308 Other specified dermatitis: Secondary | ICD-10-CM | POA: Diagnosis not present

## 2018-11-26 ENCOUNTER — Other Ambulatory Visit: Payer: Self-pay | Admitting: Cardiology

## 2018-11-26 DIAGNOSIS — E785 Hyperlipidemia, unspecified: Secondary | ICD-10-CM

## 2018-11-26 NOTE — Telephone Encounter (Signed)
Ok to refill but needs FLP and ALT

## 2018-11-26 NOTE — Telephone Encounter (Signed)
Refilled last by ED doctor. Dr. Radford Pax manages cholesterol medication. Recent fasting labs.

## 2018-11-27 NOTE — Addendum Note (Signed)
Addended by: Sarina Ill on: 11/27/2018 10:42 AM   Modules accepted: Orders

## 2018-11-27 NOTE — Telephone Encounter (Signed)
Spoke with the patient, he accepted coming in for fasting labs on 12/11/18.

## 2018-12-11 ENCOUNTER — Other Ambulatory Visit: Payer: 59

## 2018-12-30 ENCOUNTER — Ambulatory Visit: Admit: 2018-12-30 | Payer: 59 | Admitting: Orthopedic Surgery

## 2018-12-30 SURGERY — ARTHROPLASTY, KNEE, TOTAL
Anesthesia: Choice | Laterality: Left

## 2019-01-10 ENCOUNTER — Telehealth: Payer: Self-pay | Admitting: Cardiology

## 2019-01-10 NOTE — Telephone Encounter (Signed)
Video/doxy.me/smartphone/[phone]//verbal consent/virtals/  YOUR CARDIOLOGY TEAM HAS ARRANGED FOR AN E-VISIT FOR YOUR APPOINTMENT - PLEASE REVIEW IMPORTANT INFORMATION BELOW SEVERAL DAYS PRIOR TO YOUR APPOINTMENT  Due to the recent COVID-19 pandemic, we are transitioning in-person office visits to tele-medicine visits in an effort to decrease unnecessary exposure to our patients, their families, and staff. These visits are billed to your insurance just like a normal visit is. We also encourage you to sign up for MyChart if you have not already done so. You will need a smartphone if possible. For patients that do not have this, we can still complete the visit using a regular telephone but do prefer a smartphone to enable video when possible. You may have a family member that lives with you that can help. If possible, we also ask that you have a blood pressure cuff and scale at home to measure your blood pressure, heart rate and weight prior to your scheduled appointment. Patients with clinical needs that need an in-person evaluation and testing will still be able to come to the office if absolutely necessary. If you have any questions, feel free to call our office.     YOUR PROVIDER WILL BE USING THE FOLLOWING PLATFORM TO COMPLETE YOUR VISIT:  Doxy.me    IF USING MYCHART - How to Download the MyChart App to Your SmartPhone   - If Apple, go to [employer] and type in MyChart in the search bar and download the app. If Android, ask patient to go to [employer] and type in [place] in the search bar and download the app. The app is free but as with any other app downloads, your phone may require you to verify saved payment information or Apple/Android password.  - You will need to then log into the app with your MyChart username and password, and select [facility] as your healthcare provider to link the account.  - When it is time for your visit, go to the MyChart app, find appointments, and  click Begin Video Visit. Be sure to Select Allow for your device to access the Microphone and Camera for your visit. You will then be connected, and your provider will be with you shortly.  **If you have any issues connecting or need assistance, please contact MyChart service desk [phone])**  **If using a computer, in order to ensure the best quality for your visit, you will need to use either of the following Internet Browsers: Insurance underwriter or Microsoft Edge**   IF USING DOXIMITY or DOXY.ME - The staff will give you instructions on receiving your link to join the meeting the day of your visit.      2-3 DAYS BEFORE YOUR APPOINTMENT  You will receive a telephone call from one of our [facility] team members - your caller ID may say "Unknown caller." If this is a video visit, we will walk you through how to get the video launched on your phone. We will remind you check your blood pressure, heart rate and weight prior to your scheduled appointment. If you have an Apple Watch or Kardia, please upload any pertinent ECG strips the day before or morning of your appointment to [facility]. Our staff will also make sure you have reviewed the consent and agree to move forward with your scheduled tele-health visit.     THE DAY OF YOUR APPOINTMENT  Approximately 15 minutes prior to your scheduled appointment, you will receive a telephone call from one of [facility] team - your caller  ID may say "Unknown caller."  Our staff will confirm medications, vital signs for the day and any symptoms you may be experiencing. Please have this information available prior to the time of visit start. It may also be helpful for you to have a pad of paper and pen handy for any instructions given during your visit. They will also walk you through joining the smartphone meeting if this is a video visit.    CONSENT FOR TELE-HEALTH VISIT - PLEASE REVIEW  I hereby voluntarily request, consent and authorize [facility] and its employed or contracted physicians, physician assistants, nurse practitioners or other licensed health care professionals (the Practitioner), to provide me with telemedicine health care services (the Services") as deemed necessary by the treating Practitioner. I acknowledge and consent to receive the Services by the Practitioner via telemedicine. I understand that the telemedicine visit will involve communicating with the Practitioner through live audiovisual communication technology and the disclosure of certain medical information by electronic transmission. I acknowledge that I have been given the opportunity to request an in-person assessment or other available alternative prior to the telemedicine visit and am voluntarily participating in the telemedicine visit.  I understand that I have the right to withhold or withdraw my consent to the use of telemedicine in the course of my care at any time, without affecting my right to future care or treatment, and that the Practitioner or I may terminate the telemedicine visit at any time. I understand that I have the right to inspect all information obtained and/or recorded in the course of the telemedicine visit and may receive copies of available information for a reasonable fee.  I understand that some of the potential risks of receiving the Services via telemedicine include:   Delay or interruption in medical evaluation due to technological equipment failure or disruption;  Information transmitted may not be sufficient (e.g. poor resolution of images) to allow for appropriate medical decision making by the Practitioner; and/or   In rare instances, security protocols could fail, causing a breach of personal health information.  Furthermore, I acknowledge that it is my responsibility to provide information about my medical history, conditions and care that is complete and accurate to the best of my ability. I acknowledge that Practitioner's  advice, recommendations, and/or decision may be based on factors not within their control, such as incomplete or inaccurate data provided by me or distortions of diagnostic images or specimens that may result from electronic transmissions. I understand that the practice of medicine is not an exact science and that Practitioner makes no warranties or guarantees regarding treatment outcomes. I acknowledge that I will receive a copy of this consent concurrently upon execution via email to the email address I last provided but may also request a printed copy by calling the office of [facility].    I understand that my insurance will be billed for this visit.   I have read or had this consent read to me.  I understand the contents of this consent, which adequately explains the benefits and risks of the Services being provided via telemedicine.   I have been provided ample opportunity to ask questions regarding this consent and the Services and have had my questions answered to my satisfaction.  I give my informed consent for the services to be provided through the use of telemedicine in my medical care  By participating in this telemedicine visit I agree to the above.

## 2019-01-13 NOTE — Telephone Encounter (Signed)
Virtual Visit Pre-Appointment Phone Call  "(Name), I am calling you today to discuss your upcoming appointment. We are currently trying to limit exposure to the virus that causes COVID-19 by seeing patients at home rather than in the office."  1. "What is the BEST phone number to call the day of the visit?" - include this in appointment notes  2. Do you have or have access to (through a family member/friend) a smartphone with video capability that we can use for your visit?" a. If yes - list this number in appt notes as cell (if different from BEST phone #) and list the appointment type as a VIDEO visit in appointment notes b. If no - list the appointment type as a PHONE visit in appointment notes  Confirm consent - "In the setting of the current Covid19 crisis, you are scheduled for a (phone or video) visit with your provider on (date) at (time).  Just as we do with many in-office visits, in order for you to participate in this visit, we must obtain consent.  If you'd like, I can send this to your mychart (if signed up) or email for you to review.  Otherwise, I can obtain your verbal consent now.  All virtual visits are billed to your insurance company just like a normal visit would be.  By agreeing to a virtual visit, we'd like you to understand that the technology does not allow for your provider to perform an examination, and thus may limit your provider's ability to fully assess your condition. If your provider identifies any concerns that need to be evaluated in person, we will make arrangements to do so.  Finally, though the technology is pretty good, we cannot assure that it will always work on either your or our end, and in the setting of a video visit, we may have to convert it to a phone-only visit.  In either situation, we cannot ensure that we have a secure connection.  Are you willing to proceed?"    Yes/Video/doxy.me/[phone]//verbal consent/virtals/   3. Advise  patient to be prepared - "Two hours prior to your appointment, go ahead and check your blood pressure, pulse, oxygen saturation, and your weight (if you have the equipment to check those) and write them all down. When your visit starts, your provider will ask you for this information. If you have an Apple Watch or Kardia device, please plan to have heart rate information ready on the day of your appointment. Please have a pen and paper handy nearby the day of the visit as well."  4. Give patient instructions for MyChart download to smartphone OR Doximity/Doxy.me as below if video visit (depending on what platform provider is using)  5. Inform patient they will receive a phone call 15 minutes prior to their appointment time (may be from unknown caller ID) so they should be prepared to answer    TELEPHONE CALL NOTE  Marcus Greer has been deemed a candidate for a follow-up tele-health visit to limit community exposure during the Covid-19 pandemic. I spoke with the patient via phone to ensure availability of phone/video source, confirm preferred email & phone number, and discuss instructions and expectations.  I reminded Marcus Greer to be prepared with any vital sign and/or heart rhythm information that could potentially be obtained via home monitoring, at the time of his visit. I reminded Marcus Greer to expect a phone call prior to his visit.  Armando Gang 01/13/2019 12:25  PM   INSTRUCTIONS FOR DOWNLOADING THE MYCHART APP TO SMARTPHONE  - The patient must first make sure to have activated MyChart and know their login information - If Apple, go to [employer] and type in MyChart in the search bar and download the app. If Android, ask patient to go to [employer] and type in [place] in the search bar and download the app. The app is free but as with any other app downloads, their phone may require them to verify saved payment information or Apple/Android password.  - The patient  will need to then log into the app with their MyChart username and password, and select [facility] as their healthcare provider to link the account. When it is time for your visit, go to the MyChart app, find appointments, and click Begin Video Visit. Be sure to Select Allow for your device to access the Microphone and Camera for your visit. You will then be connected, and your provider will be with you shortly.  **If they have any issues connecting, or need assistance please contact MyChart service desk [phone])**  **If using a computer, in order to ensure the best quality for their visit they will need to use either of the following Internet Browsers: [employer], or Google Chrome**  IF USING DOXIMITY or DOXY.ME - The patient will receive a link just prior to their visit by text.     FULL LENGTH CONSENT FOR TELE-HEALTH VISIT   I hereby voluntarily request, consent and authorize [facility] and its employed or contracted physicians, physician assistants, nurse practitioners or other licensed health care professionals (the Practitioner), to provide me with telemedicine health care services (the Services") as deemed necessary by the treating Practitioner. I acknowledge and consent to receive the Services by the Practitioner via telemedicine. I understand that the telemedicine visit will involve communicating with the Practitioner through live audiovisual communication technology and the disclosure of certain medical information by electronic transmission. I acknowledge that I have been given the opportunity to request an in-person assessment or other available alternative prior to the telemedicine visit and am voluntarily participating in the telemedicine visit.  I understand that I have the right to withhold or withdraw my consent to the use of telemedicine in the course of my care at any time, without affecting my right to future care or treatment, and that the Practitioner  or I may terminate the telemedicine visit at any time. I understand that I have the right to inspect all information obtained and/or recorded in the course of the telemedicine visit and may receive copies of available information for a reasonable fee.  I understand that some of the potential risks of receiving the Services via telemedicine include:   Delay or interruption in medical evaluation due to technological equipment failure or disruption;  Information transmitted may not be sufficient (e.g. poor resolution of images) to allow for appropriate medical decision making by the Practitioner; and/or   In rare instances, security protocols could fail, causing a breach of personal health information.  Furthermore, I acknowledge that it is my responsibility to provide information about my medical history, conditions and care that is complete and accurate to the best of my ability. I acknowledge that Practitioner's advice, recommendations, and/or decision may be based on factors not within their control, such as incomplete or inaccurate data provided by me or distortions of diagnostic images or specimens that may result from electronic transmissions. I understand that the practice of medicine is not  an Chief Strategy Officer and that Practitioner makes no warranties or guarantees regarding treatment outcomes. I acknowledge that I will receive a copy of this consent concurrently upon execution via email to the email address I last provided but may also request a printed copy by calling the office of [facility].    I understand that my insurance will be billed for this visit.   I have read or had this consent read to me.  I understand the contents of this consent, which adequately explains the benefits and risks of the Services being provided via telemedicine.   I have been provided ample opportunity to ask questions regarding this consent and the Services and have had my questions answered to my  satisfaction.  I give my informed consent for the services to be provided through the use of telemedicine in my medical care  By participating in this telemedicine visit I agree to the above.

## 2019-01-14 ENCOUNTER — Telehealth: Payer: Self-pay | Admitting: Nurse Practitioner

## 2019-01-14 ENCOUNTER — Other Ambulatory Visit: Payer: Self-pay

## 2019-01-14 ENCOUNTER — Encounter: Payer: Self-pay | Admitting: Cardiology

## 2019-01-14 ENCOUNTER — Telehealth (INDEPENDENT_AMBULATORY_CARE_PROVIDER_SITE_OTHER): Payer: 59 | Admitting: Cardiology

## 2019-01-14 DIAGNOSIS — I2510 Atherosclerotic heart disease of native coronary artery without angina pectoris: Secondary | ICD-10-CM | POA: Diagnosis not present

## 2019-01-14 DIAGNOSIS — E7800 Pure hypercholesterolemia, unspecified: Secondary | ICD-10-CM | POA: Diagnosis not present

## 2019-01-14 DIAGNOSIS — I10 Essential (primary) hypertension: Secondary | ICD-10-CM | POA: Diagnosis not present

## 2019-01-14 NOTE — Telephone Encounter (Signed)
Ok to hold ASA for surgery 

## 2019-01-14 NOTE — Telephone Encounter (Signed)
   [facility] Pre-operative Risk Assessment    Request for surgical clearance:  1. What type of surgery is being performed?  Left Total Knee arthroplasty   2. When is this surgery scheduled? 01/28/19  3. What type of clearance is required (medical clearance vs. Pharmacy clearance to hold med vs. Both)? Both  4. Are there any medications that need to be held prior to surgery and how long? Aspirin instructions - when should patient stop and resume the medication  5. Practice name and name of physician performing surgery? [facility], Dr. Pilar Plate Alusio  6. What is your office phone number [phone]   7.   What is your office fax number [phone]  8.   Anesthesia type (None, local, MAC, general) ? Choice   Marcus Greer 01/14/2019, 4:29 PM  _________________________________________________________________   (provider comments below)

## 2019-01-14 NOTE — Patient Instructions (Signed)
Medication Instructions:  Your physician recommends that you continue on your current medications as directed. Please refer to the Current Medication list given to you today.  If you need a refill on your cardiac medications before your next appointment, please call your pharmacy.   Lab work: Fasting Labs: Lipid, Liver and BMET on 03/26/19  If you have labs (blood work) drawn today and your tests are completely normal, you will receive your results only by: Marland Kitchen MyChart Message (if you have MyChart) OR . A paper copy in the mail If you have any lab test that is abnormal or we need to change your treatment, we will call you to review the results.  Testing/Procedures: None  Follow-Up: At [facility], you and your health needs are our priority.  As part of our continuing mission to provide you with exceptional heart care, we have created designated Provider Care Teams.  These Care Teams include your primary Cardiologist (physician) and Advanced Practice Providers (APPs -  Physician Assistants and Nurse Practitioners) who all work together to provide you with the care you need, when you need it. You will need a follow up appointment in 1 years.  Please call our office 2 months in advance to schedule this appointment.  You may see Fransico Him, MD or one of the following Advanced Practice Providers on your designated Care Team:   [place], PA-C Melina Copa, PA-C . Ermalinda Barrios, PA-C

## 2019-01-14 NOTE — Progress Notes (Signed)
Virtual Visit via Video Note   This visit type was conducted due to national recommendations for restrictions regarding the COVID-19 Pandemic (e.g. social distancing) in an effort to limit this patient's exposure and mitigate transmission in our community.  Due to his co-morbid illnesses, this patient is at least at moderate risk for complications without adequate follow up.  This format is felt to be most appropriate for this patient at this time.  All issues noted in this document were discussed and addressed.  A limited physical exam was performed with this format.  Please refer to the patient's chart for his consent to telehealth for [facility]   Evaluation Performed:  Follow-up visit  This visit type was conducted due to national recommendations for restrictions regarding the COVID-19 Pandemic (e.g. social distancing).  This format is felt to be most appropriate for this patient at this time.  All issues noted in this document were discussed and addressed.  No physical exam was performed (except for noted visual exam findings with Video Visits).  Please refer to the patient's chart (MyChart message for video visits and phone note for telephone visits) for the patient's consent to telehealth for [facility].  Date:  01/14/2019   ID:  Marcus Greer, DOB 1951/07/16, MRN [id number]  Patient Location:  Home  Provider location:   [place]  PCP:  Lawerance Cruel, MD  Cardiologist:  Fransico Him, MD  Electrophysiologist:  None   Chief Complaint:  CAD, lipids  History of Present Illness:    Marcus Greer is a 68 y.o. male who presents via audio/video conferencing for a telehealth visit today.    Marcus Greer is a 68 y.o. male with a hx of ASCAD S/P PCI of the prox RCA with residual 40-50% distal RCA, dyslipidemia and GERD .  He is here today for followup and is doing well.  He denies any chest pain or pressure, SOB, DOE, PND, orthopnea, LE edema, dizziness, palpitations  or syncope. He is compliant with his meds and is tolerating meds with no SE.    The patient does not have symptoms concerning for COVID-19 infection (fever, chills, cough, or new shortness of breath).    Prior CV studies:   The following studies were reviewed today:  none  Past Medical History:  Diagnosis Date  . Cancer (HCC)    basal cell nose  . Coronary artery disease    s/p PCI of the prox RCA with residual 40-50% distal RCA 11/2002  . DJD (degenerative joint disease)   . Essential hypertension   . GERD (gastroesophageal reflux disease)   . Hyperlipidemia    Past Surgical History:  Procedure Laterality Date  . CARDIAC CATHETERIZATION     One cardiac stent 2002   . JOINT REPLACEMENT     right total knee Alsuisio 08-05-18  . KNEE ARTHROCENTESIS    . NOSE SURGERY    . SHOULDER ARTHROSCOPY     bone spur left  . TOTAL KNEE ARTHROPLASTY Right 08/05/2018   Procedure: RIGHT TOTAL KNEE ARTHROPLASTY;  Surgeon: Gaynelle Arabian, MD;  Location: [facility];  Service: Orthopedics;  Laterality: Right;  Adductor Block     Current Meds  Medication Sig  . aspirin EC 81 MG tablet Take 81 mg by mouth daily.  Marland Kitchen atorvastatin (LIPITOR) 20 MG tablet Take 1 tablet (20 mg total) by mouth daily.  . Cholecalciferol (VITAMIN D3 PO) Take 1 capsule by mouth daily.  Marland Kitchen ibuprofen (ADVIL) 200 MG tablet Take  200 mg by mouth at bedtime as needed.  . Melatonin 3 MG TABS Take 3 mg by mouth at bedtime as needed.  . Misc Natural Products (OSTEO BI-FLEX ADV JOINT SHIELD PO) Take by mouth 2 (two) times a day.  . ramipril (ALTACE) 5 MG capsule TAKE ONE CAPSULE BY MOUTH DAILY     Allergies:   Gemfibrozil; Isovue [iopamidol]; Niaspan [niacin er]; and Tape   Social History   Tobacco Use  . Smoking status: Never Smoker  . Smokeless tobacco: Never Used  Substance Use Topics  . Alcohol use: Yes    Comment: occsaional  . Drug use: Never     Family Hx: The patient's family history includes Diabetes in his  father; Epilepsy in his sister.  ROS:   Please see the history of present illness.     All other systems reviewed and are negative.   Labs/Other Tests and Data Reviewed:    Recent Labs: 08/10/2018: Hemoglobin 11.9; Platelets 222 08/12/2018: Magnesium 2.4 08/14/2018: ALT 47; BUN 14; Creatinine, Ser 0.93; Potassium 4.2; Sodium 136   Recent Lipid Panel Lab Results  Component Value Date/Time   CHOL 137 10/05/2017 08:05 AM   CHOL 124 07/25/2013 07:50 AM   TRIG 93 10/05/2017 08:05 AM   TRIG 157 (H) 07/25/2013 07:50 AM   HDL 40 10/05/2017 08:05 AM   HDL 41 07/25/2013 07:50 AM   CHOLHDL 3.4 10/05/2017 08:05 AM   CHOLHDL 3.6 08/16/2015 07:54 AM   LDLCALC 78 10/05/2017 08:05 AM   LDLCALC 52 07/25/2013 07:50 AM    Wt Readings from Last 3 Encounters:  01/14/19 212 lb (96.2 kg)  08/10/18 218 lb 4.1 oz (99 kg)  08/05/18 218 lb 4.1 oz (99 kg)     Objective:    Vital Signs:  Pulse 63   Ht 5\' 9"  (1.753 m)   Wt 212 lb (96.2 kg)   BMI 31.31 kg/m    CONSTITUTIONAL:  Well nourished, well developed male in no acute distress.  EYES: anicteric MOUTH: oral mucosa is pink RESPIRATORY: Normal respiratory effort, symmetric expansion CARDIOVASCULAR: No peripheral edema SKIN: No rash, lesions or ulcers MUSCULOSKELETAL: no digital cyanosis NEURO: Cranial Nerves II-XII grossly intact, moves all extremities PSYCH: Intact judgement and insight.  A&O x 3, Mood/affect appropriate   ASSESSMENT & PLAN:    1.  ASCAD -he is S/P PCI of the prox RCA with residual 40-50% distal RCA in 2002.  He has not had any anginal symptoms.  He will continue on aspirin 81 mg daily and statin.  2.  Hyperlipidemia -his LDL goal is less than 70.  He is due for a repeat FLP and ALT which I will set up for July once the COVID crisis has settled down.He will continue on Lipitor 20 mg daily.  I encouraged him to follow a low-fat low-carb diet.  3.  Hypertension  -he does not have a blood pressure cuff to monitor his  blood pressure.  He will continue on ramipril 5 mg daily.  I will check a bmet in July.  4.  Preoperative cardiac clearance -he is going to have total knee replacement in 2 weeks.  He had his other knee done in November 2019 and did well without any problems.  He 10 to 15 minutes 3 times weekly and go out walking without any anginal symptoms.  His revised cardiac risk index is low at 0.9% and therefore he is at low risk for any perioperative major cardiac event.  5.  COVID-19  Education:The signs and symptoms of COVID-19 were discussed with the patient and how to seek care for testing (follow up with PCP or arrange E-visit).  The importance of social distancing was discussed today.  Patient Risk:   After full review of this patient's clinical status, I feel that they are at least moderate risk at this time.  Time:   Today, I have spent 15 minutes directly with the patient on video discussing medical problems including CAD, HTN and lipids.  We also reviewed the symptoms of COVID 19 and the ways to protect against contracting the virus with telehealth technology.  I spent an additional 5 minutes reviewing patient's chart including prior OV notes and labs.  Medication Adjustments/Labs and Tests Ordered: Current medicines are reviewed at length with the patient today.  Concerns regarding medicines are outlined above.  Tests Ordered: No orders of the defined types were placed in this encounter.  Medication Changes: No orders of the defined types were placed in this encounter.   Disposition:  Follow up in 1 year(s)  Signed, Fransico Him, MD  01/14/2019 12:58 PM    [place]

## 2019-01-15 NOTE — Telephone Encounter (Signed)
   Primary Cardiologist: Fransico Him, MD  Chart reviewed as part of pre-operative protocol coverage. Dr. Radford Pax address preoperative cardiac clearance in a telemedicine visit 01/14/2019. Marcus Greer would be at acceptable risk for the planned procedure without further cardiovascular testing. He can hold aspirin x7 days prior to his upcoming knee surgery with plans to restart when cleared to do so by Dr. Maureen Ralphs.   I will route this recommendation and Dr. Theodosia Blender note from 01/14/2019 to the requesting party via [facility] fax function and remove from pre-op pool.  Please call with questions.  Abigail Butts, PA-C 01/15/2019, 7:49 AM

## 2019-01-21 DIAGNOSIS — Z01818 Encounter for other preprocedural examination: Secondary | ICD-10-CM | POA: Diagnosis not present

## 2019-01-22 ENCOUNTER — Telehealth: Payer: Self-pay | Admitting: *Deleted

## 2019-01-22 NOTE — Telephone Encounter (Signed)
Received incomplete surgical clearance form for pt. Same as previous for Rt TKA (pt cleared by Dr. Radford Pax, just nee....).the fax cuts off there.  Left detailed message on Kelly Hancock's VM at [facility] of the information above/to find out what is the specific need. Adv that if it is re: aspirin, that per per previous encounter, he is ok to hold asa x 7 days w/ plans to restart when cleared by Dr. Maureen Ralphs.

## 2019-01-30 DIAGNOSIS — M25562 Pain in left knee: Secondary | ICD-10-CM | POA: Diagnosis not present

## 2019-02-11 ENCOUNTER — Other Ambulatory Visit: Payer: 59

## 2019-02-11 DIAGNOSIS — M25562 Pain in left knee: Secondary | ICD-10-CM | POA: Diagnosis not present

## 2019-02-13 DIAGNOSIS — M25562 Pain in left knee: Secondary | ICD-10-CM | POA: Diagnosis not present

## 2019-02-18 DIAGNOSIS — M25562 Pain in left knee: Secondary | ICD-10-CM | POA: Diagnosis not present

## 2019-02-20 DIAGNOSIS — M25562 Pain in left knee: Secondary | ICD-10-CM | POA: Diagnosis not present

## 2019-02-27 DIAGNOSIS — M25562 Pain in left knee: Secondary | ICD-10-CM | POA: Diagnosis not present

## 2019-03-04 DIAGNOSIS — Z96652 Presence of left artificial knee joint: Secondary | ICD-10-CM | POA: Diagnosis not present

## 2019-03-04 DIAGNOSIS — Z471 Aftercare following joint replacement surgery: Secondary | ICD-10-CM | POA: Diagnosis not present

## 2019-03-06 DIAGNOSIS — M25562 Pain in left knee: Secondary | ICD-10-CM | POA: Diagnosis not present

## 2019-03-18 DIAGNOSIS — M25562 Pain in left knee: Secondary | ICD-10-CM | POA: Diagnosis not present

## 2019-03-26 ENCOUNTER — Other Ambulatory Visit: Payer: Self-pay

## 2019-03-26 ENCOUNTER — Telehealth: Payer: Self-pay

## 2019-03-26 ENCOUNTER — Other Ambulatory Visit: Payer: BC Managed Care – PPO | Admitting: *Deleted

## 2019-03-26 DIAGNOSIS — E785 Hyperlipidemia, unspecified: Secondary | ICD-10-CM

## 2019-03-26 DIAGNOSIS — I10 Essential (primary) hypertension: Secondary | ICD-10-CM

## 2019-03-26 DIAGNOSIS — I2510 Atherosclerotic heart disease of native coronary artery without angina pectoris: Secondary | ICD-10-CM | POA: Diagnosis not present

## 2019-03-26 LAB — HEPATIC FUNCTION PANEL
ALT: 18 IU/L (ref 0–44)
AST: 18 IU/L (ref 0–40)
Albumin: 4.5 g/dL (ref 3.8–4.8)
Alkaline Phosphatase: 71 IU/L (ref 39–117)
Bilirubin Total: 0.7 mg/dL (ref 0.0–1.2)
Bilirubin, Direct: 0.19 mg/dL (ref 0.00–0.40)
Total Protein: 6.6 g/dL (ref 6.0–8.5)

## 2019-03-26 LAB — BASIC METABOLIC PANEL
BUN/Creatinine Ratio: 13 (ref 10–24)
BUN: 17 mg/dL (ref 8–27)
CO2: 23 mmol/L (ref 20–29)
Calcium: 9.4 mg/dL (ref 8.6–10.2)
Chloride: 102 mmol/L (ref 96–106)
Creatinine, Ser: 1.26 mg/dL (ref 0.76–1.27)
GFR calc Af Amer: 69 mL/min/{1.73_m2} (ref >59)
GFR calc non Af Amer: 59 mL/min/{1.73_m2} — ABNORMAL LOW (ref >59)
Glucose: 104 mg/dL — ABNORMAL HIGH (ref 65–99)
Potassium: 4.6 mmol/L (ref 3.5–5.2)
Sodium: 142 mmol/L (ref 134–144)

## 2019-03-26 LAB — LIPID PANEL
Chol/HDL Ratio: 4.1 ratio (ref 0.0–5.0)
Cholesterol, Total: 135 mg/dL (ref 100–199)
HDL: 33 mg/dL — ABNORMAL LOW (ref >39)
LDL Calculated: 74 mg/dL (ref 0–99)
Triglycerides: 138 mg/dL (ref 0–149)
VLDL Cholesterol Cal: 28 mg/dL (ref 5–40)

## 2019-03-26 NOTE — Telephone Encounter (Signed)
-----   Message from Sueanne Margarita, MD sent at 03/26/2019  4:16 PM EDT ----- Creatinine mildly increased but still in normal range.  Forward to PCP

## 2019-03-26 NOTE — Telephone Encounter (Signed)
Notes recorded by Frederik Schmidt, RN on 03/26/2019 at 4:23 PM EDT  The patient has been notified of the result and verbalized understanding. All questions (if any) were answered.  Frederik Schmidt, RN 03/26/2019 4:23 PM

## 2019-05-15 ENCOUNTER — Other Ambulatory Visit: Payer: Self-pay | Admitting: Cardiology

## 2019-05-17 DIAGNOSIS — S39012A Strain of muscle, fascia and tendon of lower back, initial encounter: Secondary | ICD-10-CM | POA: Diagnosis not present

## 2019-06-22 DIAGNOSIS — H6983 Other specified disorders of Eustachian tube, bilateral: Secondary | ICD-10-CM | POA: Diagnosis not present

## 2019-06-27 DIAGNOSIS — H9311 Tinnitus, right ear: Secondary | ICD-10-CM | POA: Diagnosis not present

## 2019-06-27 DIAGNOSIS — R42 Dizziness and giddiness: Secondary | ICD-10-CM | POA: Diagnosis not present

## 2019-07-09 DIAGNOSIS — H6983 Other specified disorders of Eustachian tube, bilateral: Secondary | ICD-10-CM | POA: Diagnosis not present

## 2019-07-09 DIAGNOSIS — H903 Sensorineural hearing loss, bilateral: Secondary | ICD-10-CM | POA: Diagnosis not present

## 2019-07-09 DIAGNOSIS — H9113 Presbycusis, bilateral: Secondary | ICD-10-CM | POA: Diagnosis not present

## 2019-07-09 DIAGNOSIS — H833X3 Noise effects on inner ear, bilateral: Secondary | ICD-10-CM | POA: Diagnosis not present

## 2019-09-02 DIAGNOSIS — M79671 Pain in right foot: Secondary | ICD-10-CM | POA: Diagnosis not present

## 2019-09-16 DIAGNOSIS — M79671 Pain in right foot: Secondary | ICD-10-CM | POA: Diagnosis not present

## 2019-11-03 DIAGNOSIS — I10 Essential (primary) hypertension: Secondary | ICD-10-CM | POA: Diagnosis not present

## 2019-11-03 DIAGNOSIS — Z0000 Encounter for general adult medical examination without abnormal findings: Secondary | ICD-10-CM | POA: Diagnosis not present

## 2019-11-03 DIAGNOSIS — Z13220 Encounter for screening for lipoid disorders: Secondary | ICD-10-CM | POA: Diagnosis not present

## 2019-11-03 DIAGNOSIS — Z125 Encounter for screening for malignant neoplasm of prostate: Secondary | ICD-10-CM | POA: Diagnosis not present

## 2019-11-03 DIAGNOSIS — E559 Vitamin D deficiency, unspecified: Secondary | ICD-10-CM | POA: Diagnosis not present

## 2019-11-07 DIAGNOSIS — N183 Chronic kidney disease, stage 3 unspecified: Secondary | ICD-10-CM | POA: Diagnosis not present

## 2019-11-07 DIAGNOSIS — R202 Paresthesia of skin: Secondary | ICD-10-CM | POA: Diagnosis not present

## 2019-11-07 DIAGNOSIS — G5600 Carpal tunnel syndrome, unspecified upper limb: Secondary | ICD-10-CM | POA: Diagnosis not present

## 2019-11-07 DIAGNOSIS — E559 Vitamin D deficiency, unspecified: Secondary | ICD-10-CM | POA: Diagnosis not present

## 2019-11-07 DIAGNOSIS — Z0000 Encounter for general adult medical examination without abnormal findings: Secondary | ICD-10-CM | POA: Diagnosis not present

## 2019-11-10 ENCOUNTER — Other Ambulatory Visit: Payer: Self-pay | Admitting: Cardiology

## 2020-02-09 ENCOUNTER — Other Ambulatory Visit: Payer: Self-pay | Admitting: Cardiology

## 2020-02-20 ENCOUNTER — Telehealth: Payer: Self-pay | Admitting: Cardiology

## 2020-02-20 MED ORDER — ATORVASTATIN CALCIUM 20 MG PO TABS: 20.0000 mg | ORAL_TABLET | Freq: Every day | ORAL | 0 refills | Status: DC

## 2020-02-20 MED ORDER — RAMIPRIL 5 MG PO CAPS: 5.0000 mg | ORAL_CAPSULE | Freq: Every day | ORAL | 0 refills | Status: DC

## 2020-02-20 NOTE — Telephone Encounter (Signed)
Pt's medications were sent to pt's pharmacy as requested. Confirmation received.  

## 2020-02-20 NOTE — Telephone Encounter (Signed)
*  STAT* If patient is at the pharmacy, call can be transferred to refill team.   1. Which medications need to be refilled? (please list name of each medication and dose if known)  atorvastatin (LIPITOR) 20 MG tablet ramipril (ALTACE) 5 MG capsule  2. Which pharmacy/location (including street and city if local pharmacy) is medication to be sent to? [place], [place]  3. Do they need a 30 day or 90 day supply? 90 day

## 2020-04-18 NOTE — Progress Notes (Signed)
Date:  04/19/2020   ID:  Marcus Greer, DOB [birth year], MRN [id number]  PCP:  Lawerance Cruel, MD  Cardiologist:  Fransico Him, MD  Electrophysiologist:  None   Chief Complaint:  CAD, lipids  History of Present Illness:    Marcus Greer is a 69 y.o. male with a hx of ASCAD S/P PCI of the prox RCA with residual 40-50% distal RCA, dyslipidemia and GERD .  He is here today for followup and is doing well.  He denies any chest pain or pressure, SOB, DOE, PND, orthopnea, dizziness, palpitations or syncope. Occasionally has some LE edema in the right leg since his knee surgery.  He is compliant with his meds and is tolerating meds with no SE.    Prior CV studies:   The following studies were reviewed today:  EKG  Past Medical History:  Diagnosis Date  . Cancer (HCC)    basal cell nose  . Coronary artery disease    s/p PCI of the prox RCA with residual 40-50% distal RCA 11/2002  . DJD (degenerative joint disease)   . Essential hypertension   . GERD (gastroesophageal reflux disease)   . Hyperlipidemia    Past Surgical History:  Procedure Laterality Date  . CARDIAC CATHETERIZATION     One cardiac stent 2002   . JOINT REPLACEMENT     right total knee Alsuisio 08-05-18  . KNEE ARTHROCENTESIS    . NOSE SURGERY    . SHOULDER ARTHROSCOPY     bone spur left  . TOTAL KNEE ARTHROPLASTY Right 08/05/2018   Procedure: RIGHT TOTAL KNEE ARTHROPLASTY;  Surgeon: Gaynelle Arabian, MD;  Location: [facility];  Service: Orthopedics;  Laterality: Right;  Adductor Block     Current Meds  Medication Sig  . aspirin EC 81 MG tablet Take 81 mg by mouth daily.  Marland Kitchen atorvastatin (LIPITOR) 20 MG tablet Take 1 tablet (20 mg total) by mouth daily. Please keep upcoming appt in August with Dr. Radford Pax before anymore refills. Thank you  . B Complex Vitamins (B COMPLEX PO) Take 1 tablet by mouth daily.  . Cholecalciferol (VITAMIN D3 PO) Take 1 capsule by mouth daily.  Marland Kitchen ibuprofen (ADVIL) 200 MG tablet Take 200  mg by mouth at bedtime as needed.  Marland Kitchen MAGNESIUM PO Take 1 tablet by mouth daily.  . Melatonin 3 MG TABS Take 3 mg by mouth at bedtime as needed.  . ramipril (ALTACE) 5 MG capsule Take 1 capsule (5 mg total) by mouth daily. Please keep upcoming appt in August with Dr. Radford Pax before anymore refills. Thank you     Allergies:   Gemfibrozil, Iopamidol, Niacin er, Other, and Tape   Social History   Tobacco Use  . Smoking status: Never Smoker  . Smokeless tobacco: Never Used  Vaping Use  . Vaping Use: Never used  Substance Use Topics  . Alcohol use: Yes    Comment: occsaional  . Drug use: Never     Family Hx: The patient's family history includes Diabetes in his father; Epilepsy in his sister.  ROS:   Please see the history of present illness.     All other systems reviewed and are negative.   Labs/Other Tests and Data Reviewed:    Recent Labs: No results found for requested labs within last 8760 hours.   Recent Lipid Panel Lab Results  Component Value Date/Time   CHOL 135 03/26/2019 09:14 AM   CHOL 124 07/25/2013 07:50 AM   TRIG 138 03/26/2019  09:14 AM   TRIG 157 (H) 07/25/2013 07:50 AM   HDL 33 (L) 03/26/2019 09:14 AM   HDL 41 07/25/2013 07:50 AM   CHOLHDL 4.1 03/26/2019 09:14 AM   CHOLHDL 3.6 08/16/2015 07:54 AM   LDLCALC 74 03/26/2019 09:14 AM   LDLCALC 52 07/25/2013 07:50 AM    Wt Readings from Last 3 Encounters:  04/19/20 217 lb 6.4 oz (98.6 kg)  01/14/19 212 lb (96.2 kg)  08/10/18 218 lb 4.1 oz (99 kg)     Objective:    Vital Signs:  BP 130/84   Pulse 60   Ht 5\' 9"  (1.753 m)   Wt 217 lb 6.4 oz (98.6 kg)   BMI 32.10 kg/m   GEN: Well nourished, well developed in no acute distress HEENT: Normal NECK: No JVD; No carotid bruits LYMPHATICS: No lymphadenopathy CARDIAC:RRR, no murmurs, rubs, gallops RESPIRATORY:  Clear to auscultation without rales, wheezing or rhonchi  ABDOMEN: Soft, non-tender, non-distended MUSCULOSKELETAL:  No edema; No deformity    SKIN: Warm and dry NEUROLOGIC:  Alert and oriented x 3 PSYCHIATRIC:  Normal affect   EKG was performed today and showed NSR with no ST changes  ASSESSMENT & PLAN:    1.  ASCAD  -S/P PCI of the prox RCA with residual 40-50% distal RCA in 2002.   -denies any anginal symptoms -continue aspirin 81 mg daily and statin.  2.  Hyperlipidemia -his LDL goal is less than 70.   -LDL was 79 in Feb 2021>>recheck FLP and CMET -continue Atorvastatin 20mg  daily   3.  Hypertension   -BP controlled -continue Ramipril 5mg  daily   Medication Adjustments/Labs and Tests Ordered: Current medicines are reviewed at length with the patient today.  Concerns regarding medicines are outlined above.  Tests Ordered: Orders Placed This Encounter  Procedures  . EKG 12-Lead   Medication Changes: No orders of the defined types were placed in this encounter.   Disposition:  Follow up in 1 year(s)  Signed, Fransico Him, MD  04/19/2020 11:37 AM    [facility]

## 2020-04-19 ENCOUNTER — Encounter: Payer: Self-pay | Admitting: Cardiology

## 2020-04-19 ENCOUNTER — Ambulatory Visit (INDEPENDENT_AMBULATORY_CARE_PROVIDER_SITE_OTHER): Payer: Medicare Other | Admitting: Cardiology

## 2020-04-19 ENCOUNTER — Other Ambulatory Visit: Payer: Self-pay

## 2020-04-19 VITALS — BP 130/84 | HR 60 | Ht 69.0 in | Wt 217.4 lb

## 2020-04-19 DIAGNOSIS — I10 Essential (primary) hypertension: Secondary | ICD-10-CM

## 2020-04-19 DIAGNOSIS — E7800 Pure hypercholesterolemia, unspecified: Secondary | ICD-10-CM

## 2020-04-19 DIAGNOSIS — I2510 Atherosclerotic heart disease of native coronary artery without angina pectoris: Secondary | ICD-10-CM

## 2020-04-19 NOTE — Addendum Note (Signed)
Addended by: Antonieta Iba on: 04/19/2020 11:43 AM   Modules accepted: Orders

## 2020-04-19 NOTE — Patient Instructions (Signed)
Medication Instructions:  Your physician recommends that you continue on your current medications as directed. Please refer to the Current Medication list given to you today.  *If you need a refill on your cardiac medications before your next appointment, please call your pharmacy*  Lab Work: CMET and fasting lipids If you have labs (blood work) drawn today and your tests are completely normal, you will receive your results only by: . MyChart Message (if you have MyChart) OR . A paper copy in the mail If you have any lab test that is abnormal or we need to change your treatment, we will call you to review the results.  Follow-Up: At [facility], you and your health needs are our priority.  As part of our continuing mission to provide you with exceptional heart care, we have created designated Provider Care Teams.  These Care Teams include your primary Cardiologist (physician) and Advanced Practice Providers (APPs -  Physician Assistants and Nurse Practitioners) who all work together to provide you with the care you need, when you need it.  Your next appointment:   1 year(s)  The format for your next appointment:   In Person  Provider:   You may see Traci Turner, MD or one of the following Advanced Practice Providers on your designated Care Team:    Dayna Dunn, PA-C  Michele Lenze, PA-C   

## 2020-04-27 ENCOUNTER — Other Ambulatory Visit: Payer: Medicare Other

## 2020-04-27 ENCOUNTER — Other Ambulatory Visit: Payer: Self-pay

## 2020-04-27 DIAGNOSIS — I10 Essential (primary) hypertension: Secondary | ICD-10-CM

## 2020-04-27 DIAGNOSIS — E7800 Pure hypercholesterolemia, unspecified: Secondary | ICD-10-CM

## 2020-04-27 DIAGNOSIS — I2510 Atherosclerotic heart disease of native coronary artery without angina pectoris: Secondary | ICD-10-CM

## 2020-04-27 LAB — COMPREHENSIVE METABOLIC PANEL
ALT: 34 IU/L (ref 0–44)
AST: 30 IU/L (ref 0–40)
Albumin/Globulin Ratio: 2.4 — ABNORMAL HIGH (ref 1.2–2.2)
Albumin: 4.6 g/dL (ref 3.8–4.8)
Alkaline Phosphatase: 52 IU/L (ref 48–121)
BUN/Creatinine Ratio: 14 (ref 10–24)
BUN: 17 mg/dL (ref 8–27)
Bilirubin Total: 0.8 mg/dL (ref 0.0–1.2)
CO2: 21 mmol/L (ref 20–29)
Calcium: 9 mg/dL (ref 8.6–10.2)
Chloride: 103 mmol/L (ref 96–106)
Creatinine, Ser: 1.25 mg/dL (ref 0.76–1.27)
GFR calc Af Amer: 69 mL/min/{1.73_m2} (ref >59)
GFR calc non Af Amer: 60 mL/min/{1.73_m2} (ref >59)
Globulin, Total: 1.9 g/dL (ref 1.5–4.5)
Glucose: 103 mg/dL — ABNORMAL HIGH (ref 65–99)
Potassium: 4.4 mmol/L (ref 3.5–5.2)
Sodium: 138 mmol/L (ref 134–144)
Total Protein: 6.5 g/dL (ref 6.0–8.5)

## 2020-04-27 LAB — LIPID PANEL
Chol/HDL Ratio: 3.9 ratio (ref 0.0–5.0)
Cholesterol, Total: 138 mg/dL (ref 100–199)
HDL: 35 mg/dL — ABNORMAL LOW (ref >39)
LDL Chol Calc (NIH): 77 mg/dL (ref 0–99)
Triglycerides: 146 mg/dL (ref 0–149)
VLDL Cholesterol Cal: 26 mg/dL (ref 5–40)

## 2020-06-13 ENCOUNTER — Other Ambulatory Visit: Payer: Self-pay | Admitting: Cardiology

## 2020-11-10 ENCOUNTER — Other Ambulatory Visit: Payer: Self-pay | Admitting: Family Medicine

## 2020-11-10 DIAGNOSIS — Z136 Encounter for screening for cardiovascular disorders: Secondary | ICD-10-CM

## 2020-11-11 ENCOUNTER — Other Ambulatory Visit (HOSPITAL_COMMUNITY): Payer: Self-pay | Admitting: Orthopedic Surgery

## 2020-11-11 ENCOUNTER — Other Ambulatory Visit: Payer: Self-pay

## 2020-11-11 ENCOUNTER — Ambulatory Visit (HOSPITAL_COMMUNITY)
Admission: RE | Admit: 2020-11-11 | Discharge: 2020-11-11 | Disposition: A | Discharge status: Home / Self Care | Payer: Medicare Other | Source: Ambulatory Visit | Attending: Cardiovascular Disease | Admitting: Cardiovascular Disease

## 2020-11-11 DIAGNOSIS — M7989 Other specified soft tissue disorders: Secondary | ICD-10-CM | POA: Insufficient documentation

## 2020-11-11 DIAGNOSIS — M79661 Pain in right lower leg: Secondary | ICD-10-CM | POA: Diagnosis not present

## 2020-11-23 ENCOUNTER — Ambulatory Visit: Payer: Medicare Other

## 2020-12-01 ENCOUNTER — Ambulatory Visit
Admission: RE | Admit: 2020-12-01 | Discharge: 2020-12-01 | Disposition: A | Discharge status: Home / Self Care | Payer: Medicare Other | Source: Ambulatory Visit | Attending: Family Medicine | Admitting: Family Medicine

## 2020-12-01 ENCOUNTER — Other Ambulatory Visit: Payer: Self-pay

## 2020-12-01 DIAGNOSIS — Z136 Encounter for screening for cardiovascular disorders: Secondary | ICD-10-CM

## 2021-06-16 ENCOUNTER — Other Ambulatory Visit: Payer: Self-pay | Admitting: Cardiology

## 2021-06-22 NOTE — Progress Notes (Signed)
Date:  06/23/2021   ID:  Marcus Greer, DOB 06-03-1951, MRN [id number]  PCP:  Marcus Cruel, MD  Cardiologist:  Marcus Him, MD  Electrophysiologist:  None   Chief Complaint:  CAD, lipids  History of Present Illness:    Marcus Greer is a 70 y.o. male with a hx of ASCAD S/P PCI of the prox RCA with residual 40-50% distal RCA, dyslipidemia and GERD .  He is here today for followup and is doing well.  He denies any chest pain or pressure (except with extreme exertion), SOB, DOE, PND, orthopnea, dizziness (except vertigo related to sinus problems), palpitations or syncope. Occasionally has some LE edema in the right leg since his knee surgery.  He is compliant with his meds and is tolerating meds with no SE.    Prior CV studies:   The following studies were reviewed today:  EKG  Past Medical History:  Diagnosis Date   Cancer ([place])    basal cell nose   Coronary artery disease    s/p PCI of the prox RCA with residual 40-50% distal RCA 11/2002   DJD (degenerative joint disease)    Essential hypertension    GERD (gastroesophageal reflux disease)    Hyperlipidemia    Past Surgical History:  Procedure Laterality Date   CARDIAC CATHETERIZATION     One cardiac stent 2002    JOINT REPLACEMENT     right total knee Alsuisio 08-05-18   KNEE ARTHROCENTESIS     NOSE SURGERY     SHOULDER ARTHROSCOPY     bone spur left   TOTAL KNEE ARTHROPLASTY Right 08/05/2018   Procedure: RIGHT TOTAL KNEE ARTHROPLASTY;  Surgeon: Marcus Arabian, MD;  Location: [facility];  Service: Orthopedics;  Laterality: Right;  Adductor Block     Current Meds  Medication Sig   aspirin EC 81 MG tablet Take 81 mg by mouth daily.   atorvastatin (LIPITOR) 20 MG tablet TAKE ONE TABLET BY MOUTH DAILY   B Complex Vitamins (B COMPLEX PO) Take 1 tablet by mouth daily.   Cholecalciferol (VITAMIN D3 PO) Take 1 capsule by mouth daily.   ibuprofen (ADVIL) 200 MG tablet Take 200 mg by mouth at bedtime as needed.    MAGNESIUM PO Take 1 tablet by mouth daily.   Melatonin 3 MG TABS Take 3 mg by mouth at bedtime as needed.   ramipril (ALTACE) 5 MG capsule TAKE ONE CAPSULE BY MOUTH DAILY     Allergies:   Gemfibrozil, Iopamidol, Niacin er, Other, and Tape   Social History   Tobacco Use   Smoking status: Never   Smokeless tobacco: Never  Vaping Use   Vaping Use: Never used  Substance Use Topics   Alcohol use: Yes    Comment: occsaional   Drug use: Never     Family Hx: The patient's family history includes Diabetes in his father; Epilepsy in his sister.  ROS:   Please see the history of present illness.     All other systems reviewed and are negative.   Labs/Other Tests and Data Reviewed:    Recent Labs: No results found for requested labs within last 8760 hours.   Recent Lipid Panel Lab Results  Component Value Date/Time   CHOL 138 04/27/2020 08:07 AM   CHOL 124 07/25/2013 07:50 AM   TRIG 146 04/27/2020 08:07 AM   TRIG 157 (H) 07/25/2013 07:50 AM   HDL 35 (L) 04/27/2020 08:07 AM   HDL 41 07/25/2013 07:50 AM  CHOLHDL 3.9 04/27/2020 08:07 AM   CHOLHDL 3.6 08/16/2015 07:54 AM   LDLCALC 77 04/27/2020 08:07 AM   LDLCALC 52 07/25/2013 07:50 AM    Wt Readings from Last 3 Encounters:  06/23/21 219 lb (99.3 kg)  04/19/20 217 lb 6.4 oz (98.6 kg)  01/14/19 212 lb (96.2 kg)     Objective:    Vital Signs:  BP 124/82   Pulse (!) 57   Ht '5\' 9"'  (1.753 m)   Wt 219 lb (99.3 kg)   SpO2 96%   BMI 32.34 kg/m   GEN: Well nourished, well developed in no acute distress HEENT: Normal NECK: No JVD; No carotid bruits LYMPHATICS: No lymphadenopathy CARDIAC:RRR, no murmurs, rubs, gallops RESPIRATORY:  Clear to auscultation without rales, wheezing or rhonchi  ABDOMEN: Soft, non-tender, non-distended MUSCULOSKELETAL:  No edema; No deformity  SKIN: Warm and dry NEUROLOGIC:  Alert and oriented x 3 PSYCHIATRIC:  Normal affect   EKG was performed today and showed sinus bradycardia at 57bpm  with no ST changes  ASSESSMENT & PLAN:    1.  ASCAD  -S/P PCI of the prox RCA with residual 40-50% distal RCA in 2002.   -he has not had any anginal sx with normal activity but does get some if he pushes himself in the yard for in the gym -I will get a Lexixcan myoview to rule out ischemia -Shared Decision Making/Informed Consent The risks [chest pain, shortness of breath, cardiac arrhythmias, dizziness, blood pressure fluctuations, myocardial infarction, stroke/transient ischemic attack, nausea, vomiting, allergic reaction, radiation exposure, metallic taste sensation and life-threatening complications (estimated to be 1 in 10,000)], benefits (risk stratification, diagnosing coronary artery disease, treatment guidance) and alternatives of a nuclear stress test were discussed in detail with Marcus Greer and he agrees to proceed. -continue aspirin 81 mg daily and statin.  2.  Hyperlipidemia -his LDL goal is less than 70.   -I have personally reviewed and interpreted outside labs performed by patient's PCP which showed SCr LDL 84, HDL 38, TAG ALT 39  -continue prescription drug management with Atorvastatin 56m daily>refilled -repeat FLP and ALT  3.  Hypertension   -BP controlled on exam today -check BMET -continue prescription drug management with Ramipril 552mdaily>refilled   Medication Adjustments/Labs and Tests Ordered: Current medicines are reviewed at length with the patient today.  Concerns regarding medicines are outlined above.  Tests Ordered: Orders Placed This Encounter  Procedures   Lipid Profile   Comp Met (CMET)   Cardiac Stress Test: Informed Consent Details: Physician/Practitioner Attestation; Transcribe to consent form and obtain patient signature   MYOCARDIAL PERFUSION IMAGING   EKG 12-Lead    Medication Changes: No orders of the defined types were placed in this encounter.   Disposition:  Follow up in 1 year(s)  Signed, TrFransico HimMD  06/23/2021 8:39 AM     [facility]

## 2021-06-23 ENCOUNTER — Encounter: Payer: Self-pay | Admitting: Cardiology

## 2021-06-23 ENCOUNTER — Ambulatory Visit (INDEPENDENT_AMBULATORY_CARE_PROVIDER_SITE_OTHER): Payer: Medicare Other | Admitting: Cardiology

## 2021-06-23 ENCOUNTER — Encounter: Payer: Self-pay | Admitting: *Deleted

## 2021-06-23 ENCOUNTER — Other Ambulatory Visit: Payer: Self-pay

## 2021-06-23 VITALS — BP 124/82 | HR 57 | Ht 69.0 in | Wt 219.0 lb

## 2021-06-23 DIAGNOSIS — I10 Essential (primary) hypertension: Secondary | ICD-10-CM | POA: Diagnosis not present

## 2021-06-23 DIAGNOSIS — I2510 Atherosclerotic heart disease of native coronary artery without angina pectoris: Secondary | ICD-10-CM

## 2021-06-23 DIAGNOSIS — R072 Precordial pain: Secondary | ICD-10-CM | POA: Diagnosis not present

## 2021-06-23 DIAGNOSIS — E7800 Pure hypercholesterolemia, unspecified: Secondary | ICD-10-CM

## 2021-06-23 NOTE — Patient Instructions (Addendum)
Medication Instructions:   Your physician recommends that you continue on your current medications as directed. Please refer to the Current Medication list given to you today.  *If you need a refill on your cardiac medications before your next appointment, please call your pharmacy*   Lab Work:  LIPIDS AND CMET  IN EARLY December SAME DAY AS YOUR LEXISCAN APPOINTMENT--PLEASE COME FASTING TO THIS LAB APPOINTMENT  If you have labs (blood work) drawn today and your tests are completely normal, you will receive your results only by: [place] (if you have MyChart) OR A paper copy in the mail If you have any lab test that is abnormal or we need to change your treatment, we will call you to review the results.   Testing/Procedures:  Your physician has requested that you have a lexiscan myoview. For further information please visit [email or web address]. Please follow instruction sheet, as given.  PLEASE HAVE YOUR FASTING LIPIDS CHECKED SAME DAY AS THIS APPOINTMENT FOR EARLY December PER DR. Radford Pax    Follow-Up: At [facility], you and your health needs are our priority.  As part of our continuing mission to provide you with exceptional heart care, we have created designated Provider Care Teams.  These Care Teams include your primary Cardiologist (physician) and Advanced Practice Providers (APPs -  Physician Assistants and Nurse Practitioners) who all work together to provide you with the care you need, when you need it.  We recommend signing up for the patient portal called "MyChart".  Sign up information is provided on this After Visit Summary.  MyChart is used to connect with patients for Virtual Visits (Telemedicine).  Patients are able to view lab/test results, encounter notes, upcoming appointments, etc.  Non-urgent messages can be sent to your provider as well.   To learn more about what you can do with MyChart, go to [email or web address].    Your next appointment:   1  year(s)  The format for your next appointment:   In Person  Provider:   Fransico Him, MD

## 2021-08-19 ENCOUNTER — Telehealth (HOSPITAL_COMMUNITY): Payer: Self-pay | Admitting: *Deleted

## 2021-08-19 NOTE — Telephone Encounter (Signed)
Left message on voicemail per DPR in reference to upcoming appointment scheduled on 08/26/21 at 7:15 with detailed instructions given per Myocardial Perfusion Study Information Sheet for the test. LM to arrive 15 minutes early, and that it is imperative to arrive on time for appointment to keep from having the test rescheduled. If you need to cancel or reschedule your appointment, please call the office within 24 hours of your appointment. Failure to do so may result in a cancellation of your appointment, and a $50 no show fee. Phone number given for call back for any questions.

## 2021-08-26 ENCOUNTER — Other Ambulatory Visit: Payer: Medicare Other | Admitting: *Deleted

## 2021-08-26 ENCOUNTER — Other Ambulatory Visit: Payer: Self-pay

## 2021-08-26 ENCOUNTER — Ambulatory Visit (HOSPITAL_COMMUNITY): Payer: Medicare Other | Attending: Cardiology

## 2021-08-26 DIAGNOSIS — I2510 Atherosclerotic heart disease of native coronary artery without angina pectoris: Secondary | ICD-10-CM

## 2021-08-26 DIAGNOSIS — R072 Precordial pain: Secondary | ICD-10-CM | POA: Diagnosis present

## 2021-08-26 DIAGNOSIS — E7800 Pure hypercholesterolemia, unspecified: Secondary | ICD-10-CM

## 2021-08-26 DIAGNOSIS — I10 Essential (primary) hypertension: Secondary | ICD-10-CM

## 2021-08-26 LAB — MYOCARDIAL PERFUSION IMAGING
LV dias vol: 75 mL (ref 62–150)
LV sys vol: 21 mL
Nuc Stress EF: 72 %
Peak HR: 91 {beats}/min
Rest HR: 60 {beats}/min
Rest Nuclear Isotope Dose: 10.2 mCi
SDS: 0
SRS: 0
SSS: 0
ST Depression (mm): 0 mm
Stress Nuclear Isotope Dose: 31.7 mCi
TID: 0.97

## 2021-08-26 LAB — COMPREHENSIVE METABOLIC PANEL
ALT: 30 IU/L (ref 0–44)
AST: 26 IU/L (ref 0–40)
Albumin/Globulin Ratio: 2.6 — ABNORMAL HIGH (ref 1.2–2.2)
Albumin: 4.6 g/dL (ref 3.8–4.8)
Alkaline Phosphatase: 52 IU/L (ref 44–121)
BUN/Creatinine Ratio: 17 (ref 10–24)
BUN: 21 mg/dL (ref 8–27)
Bilirubin Total: 0.6 mg/dL (ref 0.0–1.2)
CO2: 22 mmol/L (ref 20–29)
Calcium: 9.1 mg/dL (ref 8.6–10.2)
Chloride: 101 mmol/L (ref 96–106)
Creatinine, Ser: 1.22 mg/dL (ref 0.76–1.27)
Globulin, Total: 1.8 g/dL (ref 1.5–4.5)
Glucose: 103 mg/dL — ABNORMAL HIGH (ref 70–99)
Potassium: 4.5 mmol/L (ref 3.5–5.2)
Sodium: 137 mmol/L (ref 134–144)
Total Protein: 6.4 g/dL (ref 6.0–8.5)
eGFR: 65 mL/min/{1.73_m2} (ref >59)

## 2021-08-26 LAB — LIPID PANEL
Chol/HDL Ratio: 4.4 ratio (ref 0.0–5.0)
Cholesterol, Total: 148 mg/dL (ref 100–199)
HDL: 34 mg/dL — ABNORMAL LOW (ref >39)
LDL Chol Calc (NIH): 92 mg/dL (ref 0–99)
Triglycerides: 118 mg/dL (ref 0–149)
VLDL Cholesterol Cal: 22 mg/dL (ref 5–40)

## 2021-08-26 MED ORDER — TECHNETIUM TC 99M TETROFOSMIN IV KIT
10.2000 | PACK | Freq: Once | INTRAVENOUS | Status: AC | PRN
  Administered 2021-08-26: 10.2 via INTRAVENOUS
  Filled 2021-08-26: qty 11

## 2021-08-26 MED ORDER — REGADENOSON 0.4 MG/5ML IV SOLN
0.4000 mg | Freq: Once | INTRAVENOUS | Status: AC
  Administered 2021-08-26: 0.4 mg via INTRAVENOUS

## 2021-08-26 MED ORDER — TECHNETIUM TC 99M TETROFOSMIN IV KIT
31.7000 | PACK | Freq: Once | INTRAVENOUS | Status: AC | PRN
  Administered 2021-08-26: 31.7 via INTRAVENOUS
  Filled 2021-08-26: qty 32

## 2022-06-18 ENCOUNTER — Other Ambulatory Visit: Payer: Self-pay | Admitting: Cardiology

## 2022-07-18 ENCOUNTER — Other Ambulatory Visit: Payer: Self-pay | Admitting: Cardiology

## 2022-08-18 ENCOUNTER — Telehealth: Payer: Self-pay | Admitting: Cardiology

## 2022-08-18 MED ORDER — ATORVASTATIN CALCIUM 20 MG PO TABS: 20.0000 mg | ORAL_TABLET | Freq: Every day | ORAL | 0 refills | Status: DC

## 2022-08-18 MED ORDER — RAMIPRIL 5 MG PO CAPS: 5.0000 mg | ORAL_CAPSULE | Freq: Every day | ORAL | 0 refills | Status: DC

## 2022-08-18 NOTE — Telephone Encounter (Signed)
*  STAT* If patient is at the pharmacy, call can be transferred to refill team.   1. Which medications need to be refilled? (please list name of each medication and dose if known)   atorvastatin (LIPITOR) 20 MG tablet    ramipril (ALTACE) 5 MG capsule    2. Which pharmacy/location (including street and city if local pharmacy) is medication to be sent to? [facility] [id number] - [place], [address]   3. Do they need a 30 day or 90 day supply?  90 day   Pt has scheduled appt on 12/04/22.

## 2022-12-04 ENCOUNTER — Ambulatory Visit: Payer: Medicare Other | Admitting: Cardiology

## 2022-12-13 IMAGING — US US ABDOMINAL AORTA SCREENING AAA
1 series · 14 of 23 positions shown · non-contrast
Comparison: None.

CLINICAL DATA: Medicare screening.

EXAM:
US ABDOMINAL AORTA MEDICARE SCREENING
TECHNIQUE: Ultrasound examination of the abdominal aorta was performed as a
screening evaluation for abdominal aortic aneurysm.

[Series 1: us abdominal aorta screening aaa · 0.31mm/px · 14 of 23 slices shown]
[im 1/23]
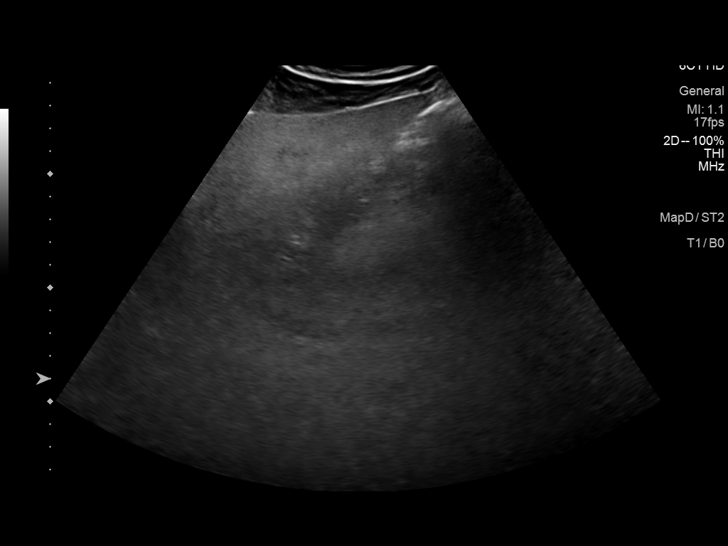
[im 3/23]
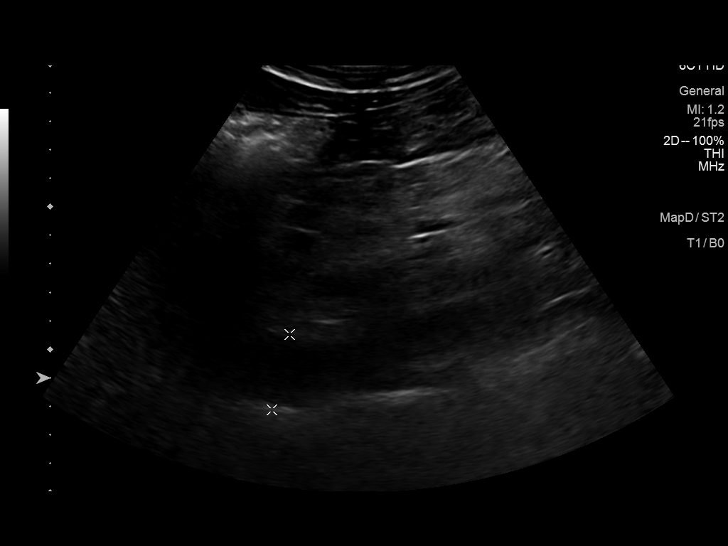
[im 5/23]
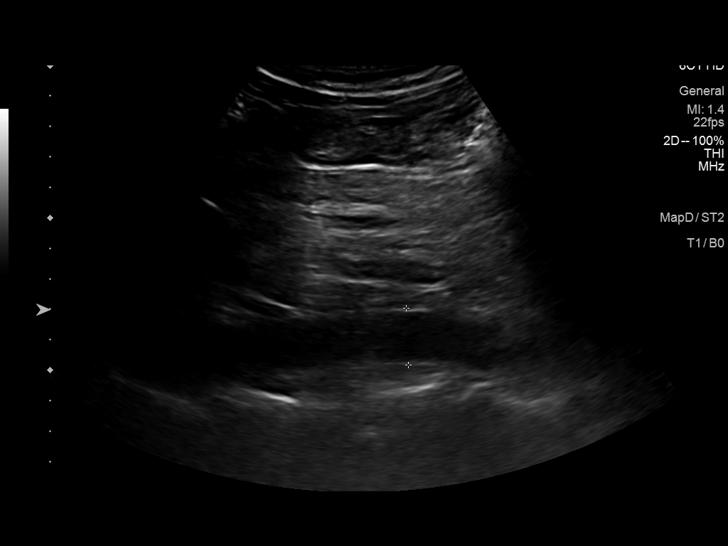
[im 6/23]
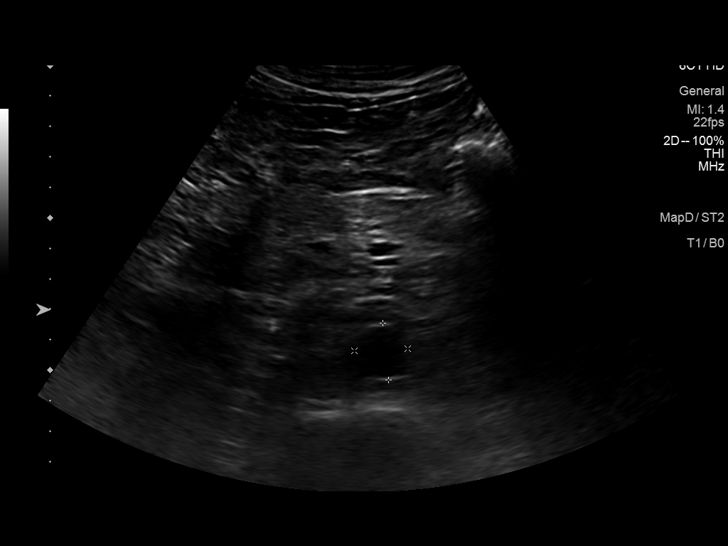
[im 8/23]
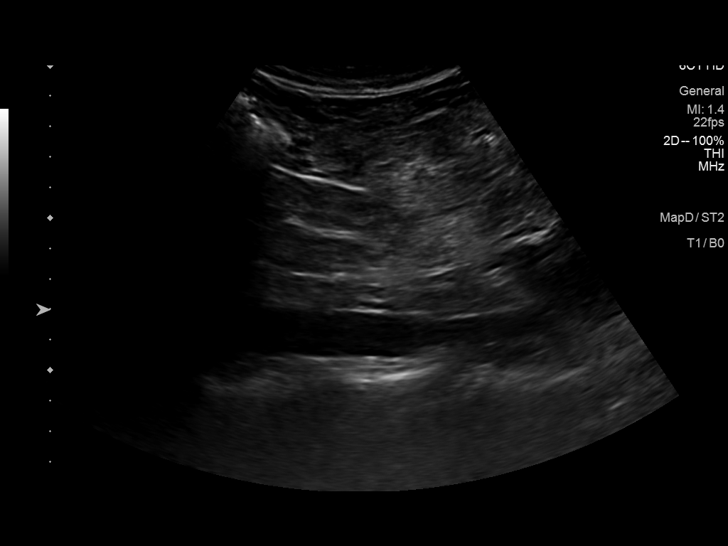
[im 10/23]
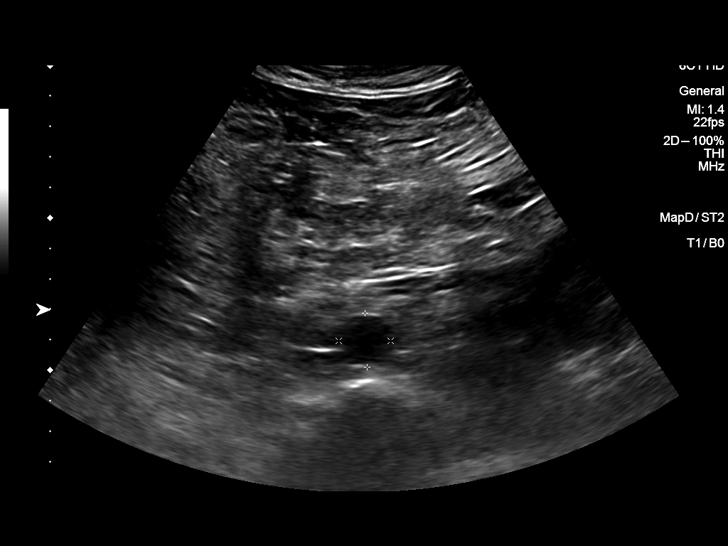
[im 11/23]
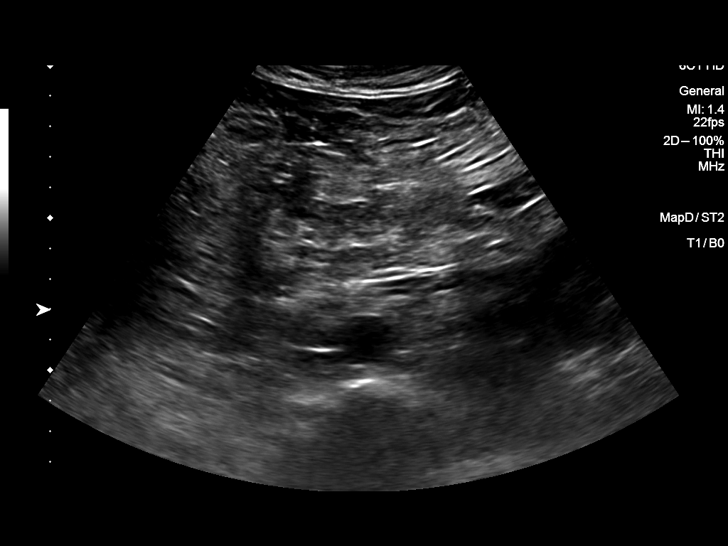
[im 13/23]
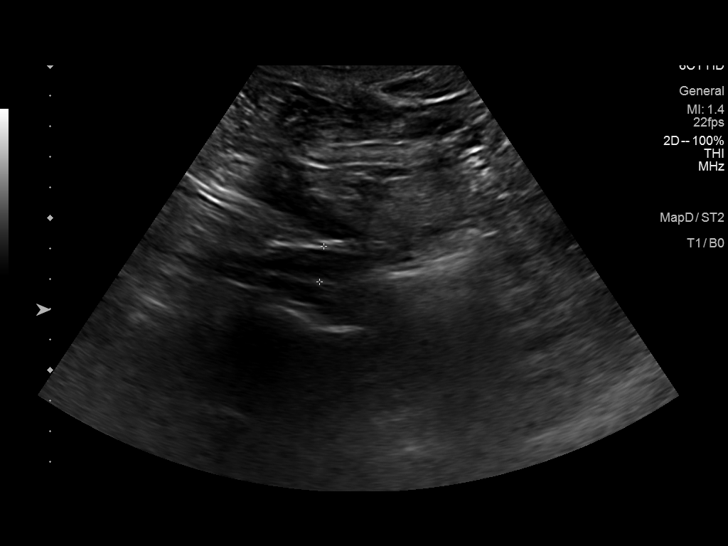
[im 14/23]
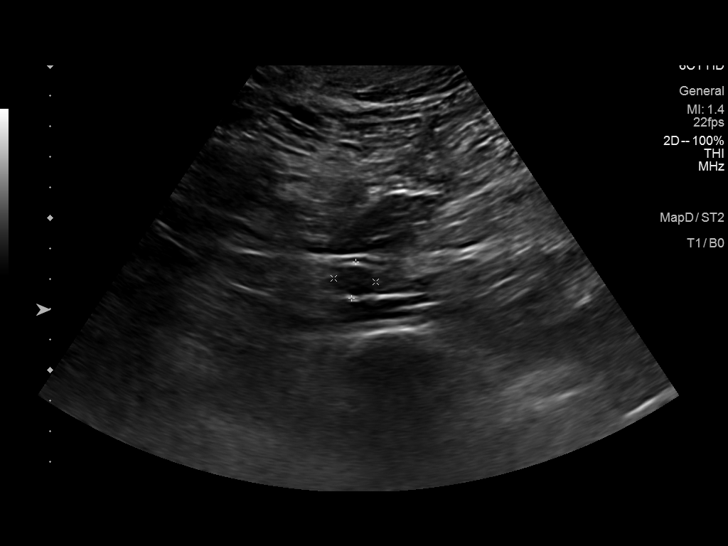
[im 16/23]
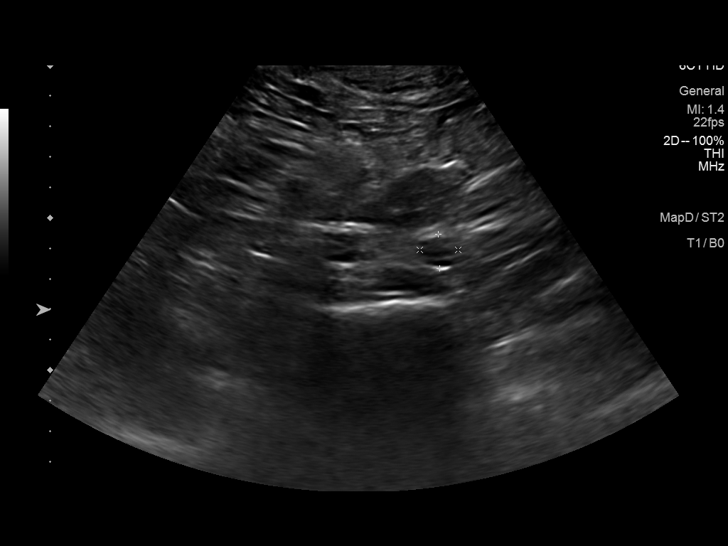
[im 18/23]
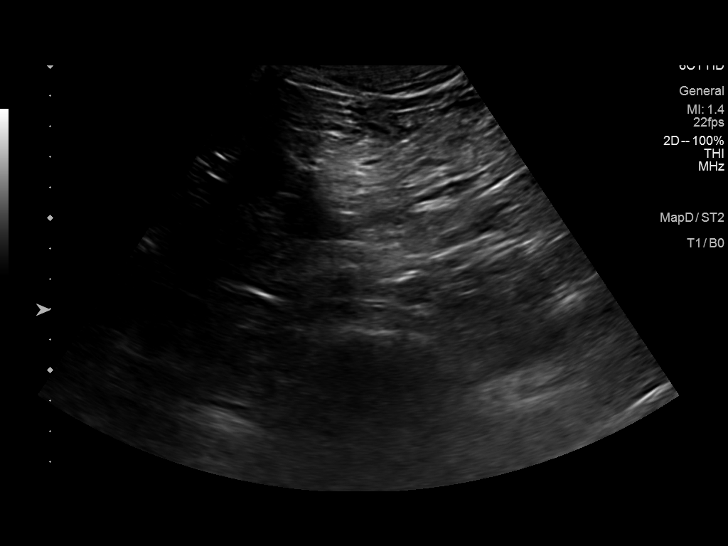
[im 19/23]
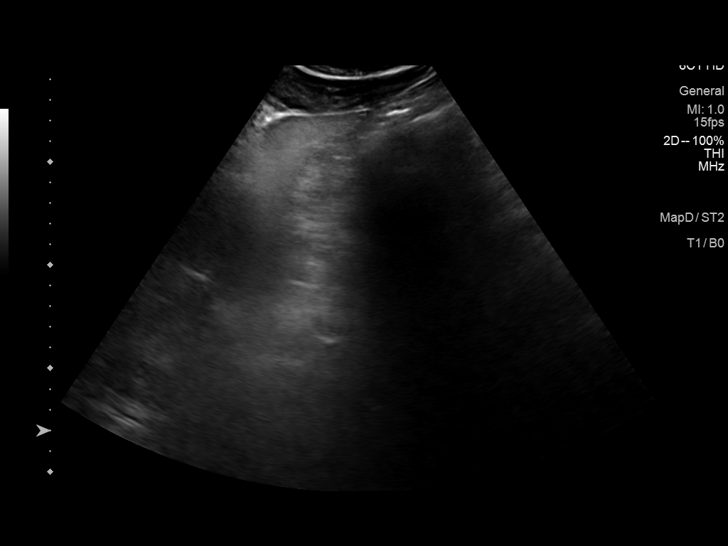
[im 21/23]
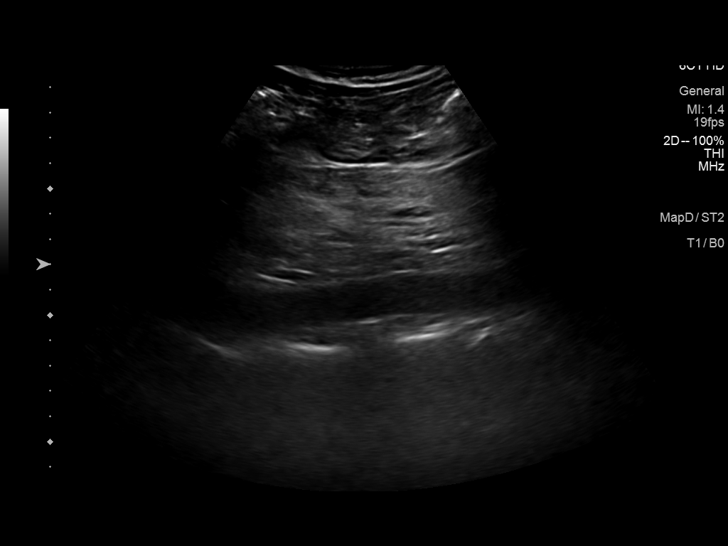
[im 23/23]
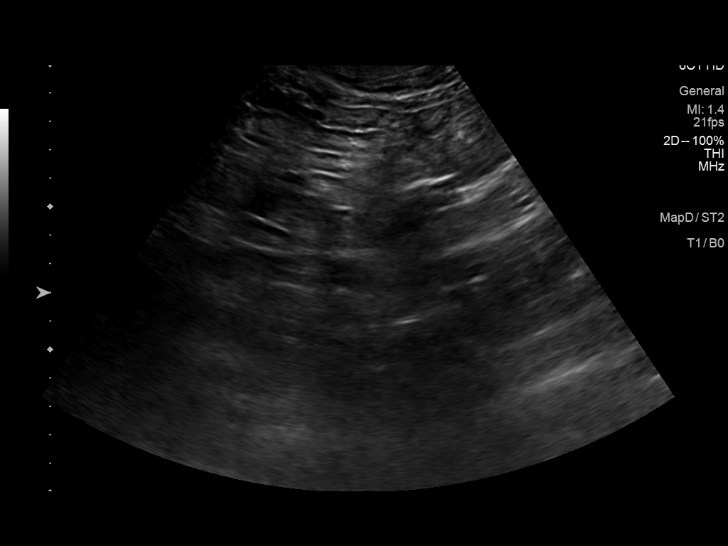

[14 of 23 positions shown; findings below may reference images not displayed]

FINDINGS: Abdominal aortic measurements as follows:

Proximal:  2.7 x 2.6  cm

Mid:  1.9 x 1.8 cm

Distal:  1.7 x 1.7 cm
IMPRESSION: No sonographic evidence of abdominal aortic aneurysm.

## 2023-01-19 ENCOUNTER — Encounter: Payer: Self-pay | Admitting: Cardiology

## 2023-01-19 ENCOUNTER — Ambulatory Visit: Payer: Medicare Other | Attending: Cardiology | Admitting: Cardiology

## 2023-01-19 VITALS — BP 124/82 | HR 71 | Ht 69.0 in | Wt 214.2 lb

## 2023-01-19 DIAGNOSIS — I10 Essential (primary) hypertension: Secondary | ICD-10-CM | POA: Insufficient documentation

## 2023-01-19 DIAGNOSIS — I2510 Atherosclerotic heart disease of native coronary artery without angina pectoris: Secondary | ICD-10-CM

## 2023-01-19 DIAGNOSIS — E7800 Pure hypercholesterolemia, unspecified: Secondary | ICD-10-CM

## 2023-01-19 NOTE — Patient Instructions (Signed)
Medication Instructions:  Your physician recommends that you continue on your current medications as directed. Please refer to the Current Medication list given to you today.  *If you need a refill on your cardiac medications before your next appointment, please call your pharmacy*   Lab Work: None.  If you have labs (blood work) drawn today and your tests are completely normal, you will receive your results only by: MyChart Message (if you have MyChart) OR A paper copy in the mail If you have any lab test that is abnormal or we need to change your treatment, we will call you to review the results.   Testing/Procedures: None.   Follow-Up: At [facility], you and your health needs are our priority.  As part of our continuing mission to provide you with exceptional heart care, we have created designated Provider Care Teams.  These Care Teams include your primary Cardiologist (physician) and Advanced Practice Providers (APPs -  Physician Assistants and Nurse Practitioners) who all work together to provide you with the care you need, when you need it.  We recommend signing up for the patient portal called "MyChart".  Sign up information is provided on this After Visit Summary.  MyChart is used to connect with patients for Virtual Visits (Telemedicine).  Patients are able to view lab/test results, encounter notes, upcoming appointments, etc.  Non-urgent messages can be sent to your provider as well.   To learn more about what you can do with MyChart, go to [email or web address].    Your next appointment:   1 year(s)  Provider:   Armanda Magic, MD     Other Instructions You have been referred to our lipid clinic. Someone will call you to get your scheduled.

## 2023-01-19 NOTE — Progress Notes (Signed)
Date:  01/19/2023   ID:  Marcus Greer, DOB Feb 03, 1951, MRN [id number]  PCP:  Daisy Floro, MD  Cardiologist:  Armanda Magic, MD  Electrophysiologist:  None   Chief Complaint:  CAD, lipids  History of Present Illness:    Marcus Greer is a 72 y.o. male with a hx of ASCAD S/P PCI of the prox RCA with residual 40-50% distal RCA, dyslipidemia and GERD .  He is here today for followup and is doing well.  He denies any chest pain or pressure, SOB, DOE, PND, orthopnea, dizziness, palpitations or syncope. He has been having problems with LE edema intermittently and also has problems with LE neuropathy.  He has had bilateral knee replacements.  He is compliant with his meds and is tolerating meds with no SE.    Prior CV studies:   The following studies were reviewed today: EKG  Past Medical History:  Diagnosis Date   Cancer (HCC)    basal cell nose   Coronary artery disease    s/p PCI of the prox RCA with residual 40-50% distal RCA 11/2002   DJD (degenerative joint disease)    Essential hypertension    GERD (gastroesophageal reflux disease)    Hyperlipidemia    Past Surgical History:  Procedure Laterality Date   CARDIAC CATHETERIZATION     One cardiac stent 2002    JOINT REPLACEMENT     right total knee Alsuisio 08-05-18   KNEE ARTHROCENTESIS     NOSE SURGERY     SHOULDER ARTHROSCOPY     bone spur left   TOTAL KNEE ARTHROPLASTY Right 08/05/2018   Procedure: RIGHT TOTAL KNEE ARTHROPLASTY;  Surgeon: Ollen Gross, MD;  Location: [facility];  Service: Orthopedics;  Laterality: Right;  Adductor Block     Current Meds  Medication Sig   aspirin EC 81 MG tablet Take 81 mg by mouth daily.   B Complex Vitamins (B COMPLEX PO) Take 1 tablet by mouth daily.   Cholecalciferol (VITAMIN D3 PO) Take 1 capsule by mouth daily.   ibuprofen (ADVIL) 200 MG tablet Take 200 mg by mouth at bedtime as needed.   ramipril (ALTACE) 5 MG capsule Take 1 capsule (5 mg total) by mouth daily.      Allergies:   Gemfibrozil, Iopamidol, Niacin er, Other, and Tape   Social History   Tobacco Use   Smoking status: Never   Smokeless tobacco: Never  Vaping Use   Vaping Use: Never used  Substance Use Topics   Alcohol use: Yes    Comment: occsaional   Drug use: Never     Family Hx: The patient's family history includes Diabetes in his father; Epilepsy in his sister.  ROS:   Please see the history of present illness.     All other systems reviewed and are negative.   Labs/Other Tests and Data Reviewed:    Recent Labs: No results found for requested labs within last 365 days.   Recent Lipid Panel Lab Results  Component Value Date/Time   CHOL 148 08/26/2021 07:20 AM   CHOL 124 07/25/2013 07:50 AM   TRIG 118 08/26/2021 07:20 AM   TRIG 157 (H) 07/25/2013 07:50 AM   HDL 34 (L) 08/26/2021 07:20 AM   HDL 41 07/25/2013 07:50 AM   CHOLHDL 4.4 08/26/2021 07:20 AM   CHOLHDL 3.6 08/16/2015 07:54 AM   LDLCALC 92 08/26/2021 07:20 AM   LDLCALC 52 07/25/2013 07:50 AM    Wt Readings from Last 3 Encounters:  01/19/23 214 lb 3.2 oz (97.2 kg)  08/26/21 219 lb (99.3 kg)  06/23/21 219 lb (99.3 kg)     Objective:    Vital Signs:  BP 124/82   Pulse 71   Ht 5\' 9"  (1.753 m)   Wt 214 lb 3.2 oz (97.2 kg)   SpO2 95%   BMI 31.63 kg/m   GEN: Well nourished, well developed in no acute distress HEENT: Normal NECK: No JVD; No carotid bruits LYMPHATICS: No lymphadenopathy CARDIAC:RRR, no murmurs, rubs, gallops RESPIRATORY:  Clear to auscultation without rales, wheezing or rhonchi  ABDOMEN: Soft, non-tender, non-distended MUSCULOSKELETAL:  No edema; No deformity  SKIN: Warm and dry NEUROLOGIC:  Alert and oriented x 3 PSYCHIATRIC:  Normal affect   EKG was performed today and showed NSR with no ST changes  ASSESSMENT & PLAN:    1.  ASCAD  -S/P PCI of the prox RCA with residual 40-50% distal RCA in 2002.   -Nuclear stress test 08/30/2021 showed no ischemia and normal LV  function -He denies any anginal symptoms -Continue prescription drug management with aspirin 81 mg daily -Increasing statin to try to get LDL less than 70  2.  Hyperlipidemia -his LDL goal is less than 70.   -I have personally reviewed and interpreted outside labs performed by patient's PCP which showed LDL 89 and HDL 42 on 11/16/2022 -he stopped his statin recently due to leg cramps which have improved and he also is concerned about brain fog -I will refer to lipid clinic to consider PCSK9i  3.  Hypertension   -BP is controlled on exam today -I have personally reviewed and interpreted outside labs performed by patient's PCP which showed serum creatinine 1.34 and potassium 4.6 on 11/16/2022 -Continue prescription drug management with ramipril 5 mg daily with as needed refills   Medication Adjustments/Labs and Tests Ordered: Current medicines are reviewed at length with the patient today.  Concerns regarding medicines are outlined above.  Tests Ordered: Orders Placed This Encounter  Procedures   EKG 12-Lead    Medication Changes: No orders of the defined types were placed in this encounter.    Disposition:  Follow up in 1 year(s)  Signed, Armanda Magic, MD  01/19/2023 10:46 AM    [facility]

## 2023-01-19 NOTE — Addendum Note (Signed)
Addended by: Luellen Pucker on: 01/19/2023 10:54 AM   Modules accepted: Orders

## 2023-01-31 ENCOUNTER — Ambulatory Visit: Payer: Medicare Other | Attending: Cardiovascular Disease | Admitting: Pharmacist

## 2023-01-31 ENCOUNTER — Telehealth: Payer: Self-pay | Admitting: Pharmacist

## 2023-01-31 DIAGNOSIS — E7800 Pure hypercholesterolemia, unspecified: Secondary | ICD-10-CM

## 2023-01-31 MED ORDER — NEXLIZET 180-10 MG PO TABS: 1.0000 | ORAL_TABLET | Freq: Every day | ORAL | 3 refills | Status: DC

## 2023-01-31 MED ORDER — ROSUVASTATIN CALCIUM 5 MG PO TABS: 5.0000 mg | ORAL_TABLET | Freq: Every day | ORAL | 3 refills | Status: DC

## 2023-01-31 NOTE — Telephone Encounter (Signed)
Nexlizet PA submitted, Key: [id number].

## 2023-01-31 NOTE — Telephone Encounter (Signed)
PA approved until further notice. Called his pharmacy, copay is $970 for a 3 month supply. [employer] grant would not cover rx for a year. Repatha would be similar. Called pt to discuss. Leqvio would be covered with his supplement but he does not want an injection. He is willing to try rosuvastatin 5mg  daily. Will recheck lipids in 2 months, can titrate dose as tolerated or add on ezetimibe if needed.

## 2023-01-31 NOTE — Patient Instructions (Signed)
Your most recent LDL cholesterol was 89. We would like this to definitely be < 70, and ideally closer to < 55  I'll submit information to your insurance for Nexlizet - once daily pill that lowers LDL cholesterol by ~40%  Back up options: Praluent injections twice monthly that lower LDL by 60%, or Leqvio injection given in a health care setting every 6 months that lowers LDL by 50%

## 2023-01-31 NOTE — Progress Notes (Signed)
Patient ID: Marcus Greer                 DOB: 1951-05-06                    MRN: [id number]     HPI: Marcus Greer is a 72 y.o. male patient referred to lipid clinic by Dr Mayford Knife. PMH is significant for CAD s/p PCI of prox RCA with residual 40-50% distal RCA in 2004, HLD, HTN, and GERD. He saw Dr Mayford Knife on 01/19/23. LDL was 89 at PCP office on 11/16/22. Pt reported stopping his atorvastatin recently due to leg cramps that improved after med d/c and was also concerned about brain fog. He was referred to discuss PCSK9i.  Pt presents today for follow up. Has been taking a statin for the last 20 years since his PCI. Has been on atorvastatin 20mg  daily the longest. Previously took 40mg  dose which caused cramping in his legs. Still noticing this on the 20mg  dose as well as some memory issues and restless leg. Also took Vytorin in the past which caused cramping. Niacin and gemfibrozil were associated with about 6 cysts on his eyelids that had to be removed, and resolved when meds were stopped. Has had bilateral knee replacements. Goes to the gym regularly. Stopped taking his atorvastatin a few weeks ago, noticed some improvement in memory and cramping. Still having some restless leg syndrome, doesn't think it's related - started OTc magnesium supplement to help.  Has [employer] Medicare D plan which has a $545 annual deductible followed by 25% copay on Tier 3 meds. His wife is on [place] and he reports they have already paid thousands out of pocket on this med and are in catastrophic coverage now.  Current Medications: none Intolerances: atorvastatin 20-40mg  daily, Vytorin - cramping; gemfibrozil, niacin - cysts on eyelids Risk Factors: premature ASCVD LDL goal: 55mg /dL per AACE or 70mg /dL per ACC/AHA  Exercise: Goes to the gym regularly  Family History: Diabetes in his father; Epilepsy in his sister.   Social History: No tobacco or drug use, occasional alcohol use.  Labs: 11/16/22: TC  150, HDL 42, TG 99, LDL 89 - atorvastatin 20mg  daily  Past Medical History:  Diagnosis Date   Cancer (HCC)    basal cell nose   Coronary artery disease    s/p PCI of the prox RCA with residual 40-50% distal RCA 11/2002   DJD (degenerative joint disease)    Essential hypertension    GERD (gastroesophageal reflux disease)    Hyperlipidemia     Current Outpatient Medications on File Prior to Visit  Medication Sig Dispense Refill   aspirin EC 81 MG tablet Take 81 mg by mouth daily.     atorvastatin (LIPITOR) 20 MG tablet Take 1 tablet (20 mg total) by mouth daily. t. (Patient not taking: Reported on 01/19/2023) 90 tablet 0   B Complex Vitamins (B COMPLEX PO) Take 1 tablet by mouth daily.     Cholecalciferol (VITAMIN D3 PO) Take 1 capsule by mouth daily.     ibuprofen (ADVIL) 200 MG tablet Take 200 mg by mouth at bedtime as needed.     MAGNESIUM PO Take 1 tablet by mouth daily. (Patient not taking: Reported on 01/19/2023)     Melatonin 3 MG TABS Take 3 mg by mouth at bedtime as needed. (Patient not taking: Reported on 01/19/2023)     ramipril (ALTACE) 5 MG capsule Take 1 capsule (5 mg total) by mouth  daily. 90 capsule 0   No current facility-administered medications on file prior to visit.    Allergies  Allergen Reactions   Gemfibrozil Other (See Comments)    Unknown Unknown   Iopamidol Hives    Pt reports that he was told after having a heart catherization that he was allergic to the contrast used, states he had hives Pt reports that he was told after having a heart catherization that he was allergic to the contrast used, states he had hives   Niacin Er Other (See Comments)    Unknown Unknown   Other Rash    Irritation   Tape Other (See Comments)    Irritation    Assessment/Plan:  1. Hyperlipidemia - LDL goal < 70 per ACC/AHA and < 55 per AACE, pt intolerant to atorvastatin and Vytorin. Reviewed options for lowering LDL cholesterol, including statin rechallenge, PCSK9  inhibitors, Nexlizet, and Leqvio. Discussed mechanisms of action, dosing, side effects and potential decreases in LDL cholesterol. Also reviewed cost information and potential options for patient assistance. Pt prefers to try Nexlizet to avoid need for injection. PCSK9i would be preferred backup option - his plan prefers Praluent. Nexlizet is non formulary but will still submit coverage request. If he's in catastrophic coverage, should have low copay still. Also recommended looking for different Part D plan next year with better branded med coverage. Will follow up with pt once coverage determination is known, and schedule follow up labs at that time.  Eiko Mcgowen E. Lamia Mariner, PharmD, BCACP, CPP [facility] [address] [place], [place] [address] Phone: [phone]; Fax: [phone] 01/31/2023 10:18 AM

## 2023-02-02 ENCOUNTER — Other Ambulatory Visit: Payer: Self-pay | Admitting: Cardiology

## 2023-04-02 ENCOUNTER — Telehealth: Payer: Self-pay | Admitting: *Deleted

## 2023-04-02 NOTE — Telephone Encounter (Signed)
   Pre-operative Risk Assessment    Patient Name: Marcus Greer  DOB: 10/10/1950 MRN: [id number]      Request for Surgical Clearance    Procedure:   COLONOSCOPY  + COLOGUARD  Date of Surgery:  Clearance 05/29/23                                 Surgeon:  DR. Liliane Shi Surgeon's Group or Practice Name:  [facility] Phone number:  [phone] Fax number:  [phone]   Type of Clearance Requested:   - Medical  NO MEDICATIONS LISTED AS NEEDING TO BE HELD   Type of Anesthesia:   PROPOFOL   Additional requests/questions:    Elpidio Anis   04/02/2023, 5:00 PM

## 2023-04-03 ENCOUNTER — Telehealth: Payer: Self-pay | Admitting: *Deleted

## 2023-04-03 NOTE — Telephone Encounter (Signed)
   Name: Marcus Greer  DOB: 1950-12-27  MRN: [id number]  Primary Cardiologist: Armanda Magic, MD   Preoperative team, please contact this patient and set up a phone call appointment for further preoperative risk assessment. Please obtain consent and complete medication review. Thank you for your help.  I confirm that guidance regarding antiplatelet and oral anticoagulation therapy has been completed and, if necessary, noted below.  Per office protocol, if patient is without any new symptoms or concerns at the time of their virtual visit, he may hold Aspirin for 5-7 days prior to procedure. Please resume Aspirin as soon as possible postprocedure, at the discretion of the surgeon.     Joylene Grapes, NP 04/03/2023, 8:06 AM [facility]

## 2023-04-03 NOTE — Telephone Encounter (Signed)
Pt has been scheduled for tele pre op appt 05/01/23 @ 9 am. Med rec and consent are done.

## 2023-04-03 NOTE — Telephone Encounter (Signed)
Pt has been scheduled for tele pre op appt 05/01/23 @ 9 am. Med rec and consent are done.      Patient Consent for Virtual Visit        Marcus Greer has provided verbal consent on 04/03/2023 for a virtual visit (video or telephone).   CONSENT FOR VIRTUAL VISIT FOR:  Marcus Greer  By participating in this virtual visit I agree to the following:  I hereby voluntarily request, consent and authorize [facility] and its employed or contracted physicians, physician assistants, nurse practitioners or other licensed health care professionals (the Practitioner), to provide me with telemedicine health care services (the "Services") as deemed necessary by the treating Practitioner. I acknowledge and consent to receive the Services by the Practitioner via telemedicine. I understand that the telemedicine visit will involve communicating with the Practitioner through live audiovisual communication technology and the disclosure of certain medical information by electronic transmission. I acknowledge that I have been given the opportunity to request an in-person assessment or other available alternative prior to the telemedicine visit and am voluntarily participating in the telemedicine visit.  I understand that I have the right to withhold or withdraw my consent to the use of telemedicine in the course of my care at any time, without affecting my right to future care or treatment, and that the Practitioner or I may terminate the telemedicine visit at any time. I understand that I have the right to inspect all information obtained and/or recorded in the course of the telemedicine visit and may receive copies of available information for a reasonable fee.  I understand that some of the potential risks of receiving the Services via telemedicine include:  Delay or interruption in medical evaluation due to technological equipment failure or disruption; Information transmitted may not be sufficient (e.g.  poor resolution of images) to allow for appropriate medical decision making by the Practitioner; and/or  In rare instances, security protocols could fail, causing a breach of personal health information.  Furthermore, I acknowledge that it is my responsibility to provide information about my medical history, conditions and care that is complete and accurate to the best of my ability. I acknowledge that Practitioner's advice, recommendations, and/or decision may be based on factors not within their control, such as incomplete or inaccurate data provided by me or distortions of diagnostic images or specimens that may result from electronic transmissions. I understand that the practice of medicine is not an exact science and that Practitioner makes no warranties or guarantees regarding treatment outcomes. I acknowledge that a copy of this consent can be made available to me via my patient portal [facility] MyChart), or I can request a printed copy by calling the office of [facility].    I understand that my insurance will be billed for this visit.   I have read or had this consent read to me. I understand the contents of this consent, which adequately explains the benefits and risks of the Services being provided via telemedicine.  I have been provided ample opportunity to ask questions regarding this consent and the Services and have had my questions answered to my satisfaction. I give my informed consent for the services to be provided through the use of telemedicine in my medical care

## 2023-04-09 ENCOUNTER — Ambulatory Visit: Payer: Medicare Other

## 2023-04-23 ENCOUNTER — Ambulatory Visit: Payer: Medicare Other | Attending: Cardiology

## 2023-04-23 DIAGNOSIS — E7800 Pure hypercholesterolemia, unspecified: Secondary | ICD-10-CM

## 2023-04-23 LAB — HEPATIC FUNCTION PANEL
ALT: 36 IU/L (ref 0–44)
AST: 36 IU/L (ref 0–40)
Albumin: 4.8 g/dL (ref 3.9–4.9)
Alkaline Phosphatase: 48 IU/L (ref 44–121)
Bilirubin Total: 0.7 mg/dL (ref 0.0–1.2)
Bilirubin, Direct: 0.17 mg/dL (ref 0.00–0.40)
Total Protein: 7.1 g/dL (ref 6.0–8.5)

## 2023-04-23 LAB — LIPID PANEL
Chol/HDL Ratio: 4.4 ratio (ref 0.0–5.0)
Cholesterol, Total: 176 mg/dL (ref 100–199)
HDL: 40 mg/dL (ref >39)
LDL Chol Calc (NIH): 102 mg/dL — ABNORMAL HIGH (ref 0–99)
Triglycerides: 194 mg/dL — ABNORMAL HIGH (ref 0–149)
VLDL Cholesterol Cal: 34 mg/dL (ref 5–40)

## 2023-04-24 ENCOUNTER — Encounter: Payer: Self-pay | Admitting: Pharmacist

## 2023-04-24 ENCOUNTER — Telehealth: Payer: Self-pay | Admitting: Pharmacist

## 2023-04-24 DIAGNOSIS — E7800 Pure hypercholesterolemia, unspecified: Secondary | ICD-10-CM

## 2023-04-24 MED ORDER — EZETIMIBE 10 MG PO TABS: 10.0000 mg | ORAL_TABLET | Freq: Every day | ORAL | 11 refills | Status: DC

## 2023-04-24 NOTE — Telephone Encounter (Signed)
Spoke with pt regarding labs. He is tolerating rosuvastatin 5mg  well. Previously intolerant to atorvastatin and Vytorin. Branded meds cost prohibitive with his plan, he has poor Part D coverage on branded meds. Reports worse diet the last 2 weeks with more sweets - explains his mildly elevated TG which is out of the ordinary for him. Discussed adding on ezetimibe which he is agreeable to. Rx sent in, f/u labs scheduled. I sent him GoodRx info as well in case this is cheaper than with his insurance.

## 2023-04-30 NOTE — Progress Notes (Unsigned)
Virtual Visit via Telephone Note   Because of Marcus Greer's co-morbid illnesses, he is at least at moderate risk for complications without adequate follow up.  This format is felt to be most appropriate for this patient at this time.  The patient did not have access to video technology/had technical difficulties with video requiring transitioning to audio format only (telephone).  All issues noted in this document were discussed and addressed.  No physical exam could be performed with this format.  Please refer to the patient's chart for his consent to telehealth for [facility].  Evaluation Performed:  Preoperative cardiovascular risk assessment _____________   Date:  04/30/2023   Patient ID:  Marcus Greer, DOB [birth year], MRN [id number] Patient Location:  Home Provider location:   Office  Primary Care Provider:  Daisy Floro, MD Primary Cardiologist:  Armanda Magic, MD  Chief Complaint / Patient Profile   72 y.o. y/o male with a h/o coronary artery disease, hypertension, hyperlipidemia who is pending colonoscopy and presents today for telephonic preoperative cardiovascular risk assessment.  History of Present Illness    Marcus Greer is a 72 y.o. male who presents via audio/video conferencing for a telehealth visit today.  Pt was last seen in cardiology clinic on 06/23/2021 by Dr. Mayford Knife.  At that time Marcus Greer was doing well .  The patient is now pending procedure as outlined above. Since his last visit, he remains stable from a cardiac standpoint.  Today he denies chest pain, shortness of breath, lower extremity edema, fatigue, palpitations, melena, hematuria, hemoptysis, diaphoresis, weakness, presyncope, syncope, orthopnea, and PND.   Past Medical History    Past Medical History:  Diagnosis Date   Cancer (HCC)    basal cell nose   Coronary artery disease    s/p PCI of the prox RCA with residual 40-50% distal RCA 11/2002   DJD (degenerative  joint disease)    Essential hypertension    GERD (gastroesophageal reflux disease)    Hyperlipidemia    Past Surgical History:  Procedure Laterality Date   CARDIAC CATHETERIZATION     One cardiac stent 2002    JOINT REPLACEMENT     right total knee Alsuisio 08-05-18   KNEE ARTHROCENTESIS     NOSE SURGERY     SHOULDER ARTHROSCOPY     bone spur left   TOTAL KNEE ARTHROPLASTY Right 08/05/2018   Procedure: RIGHT TOTAL KNEE ARTHROPLASTY;  Surgeon: Ollen Gross, MD;  Location: [facility];  Service: Orthopedics;  Laterality: Right;  Adductor Block    Allergies  Allergies  Allergen Reactions   Gemfibrozil Other (See Comments)    Cyst on eyelid   Iopamidol Hives    Pt reports that he was told after having a heart catherization that he was allergic to the contrast used, states he had hives Pt reports that he was told after having a heart catherization that he was allergic to the contrast used, states he had hives   Niacin Er Other (See Comments)    Cyst on eyelid   Other Rash    Irritation   Atorvastatin     Muscle pain   Tape Other (See Comments)    Irritation   Vytorin [Ezetimibe-Simvastatin]     Muscle pain    Home Medications    Prior to Admission medications   Medication Sig Start Date End Date Taking? Authorizing Provider  aspirin EC 81 MG tablet Take 81 mg by mouth daily.    [provider]  B Complex Vitamins (B COMPLEX PO) Take 1 tablet by mouth daily.    [provider]  Cholecalciferol (VITAMIN D3 PO) Take 1 capsule by mouth daily.    [provider]  ezetimibe (ZETIA) 10 MG tablet Take 1 tablet (10 mg total) by mouth daily. 04/24/23   Quintella Reichert, MD  ibuprofen (ADVIL) 200 MG tablet Take 200 mg by mouth at bedtime as needed.    [provider]  Magnesium Oxide 250 MG TABS Take 1 tablet by mouth daily.    [provider]  ramipril (ALTACE) 5 MG capsule TAKE 1 CAPSULE BY MOUTH DAILY 02/02/23   Quintella Reichert, MD   rosuvastatin (CRESTOR) 5 MG tablet Take 1 tablet (5 mg total) by mouth daily. 01/31/23 05/01/23  Quintella Reichert, MD    Physical Exam    Vital Signs:  Marcus Greer does not have vital signs available for review today.  Given telephonic nature of communication, physical exam is limited. AAOx3. NAD. Normal affect.  Speech and respirations are unlabored.  Accessory Clinical Findings    None  Assessment & Plan    1.  Preoperative Cardiovascular Risk Assessment: Colonoscopy, Marcus Greer, fax #[phone]      Primary Cardiologist: Armanda Magic, MD  Chart reviewed as part of pre-operative protocol coverage. Given past medical history and time since last visit, based on ACC/AHA guidelines, Marcus Greer would be at acceptable risk for the planned procedure without further cardiovascular testing.   Patient was advised that if he develops new symptoms prior to surgery to contact our office to arrange a follow-up appointment.  He verbalized understanding.  His aspirin may be held for 5 to 7 days prior to his procedure.  Please resume aspirin as soon as hemostasis is achieved.  I will route this recommendation to the requesting party via Epic fax function and remove from pre-op pool.  Please call with questions.      Time:   Today, I have spent 5 minutes with the patient with telehealth technology discussing medical history, symptoms, and management plan.  Prior to his phone evaluation I spent greater than 10 minutes reviewing his past medical history and cardiac medications.   Ronney Asters, NP  04/30/2023, 4:53 PM

## 2023-05-01 ENCOUNTER — Ambulatory Visit: Payer: Medicare Other | Attending: Cardiology

## 2023-05-01 DIAGNOSIS — Z01810 Encounter for preprocedural cardiovascular examination: Secondary | ICD-10-CM | POA: Diagnosis not present

## 2023-05-16 ENCOUNTER — Other Ambulatory Visit: Payer: Self-pay | Admitting: Cardiology

## 2023-06-18 ENCOUNTER — Ambulatory Visit: Payer: Medicare Other | Attending: Cardiology

## 2023-06-18 DIAGNOSIS — E7800 Pure hypercholesterolemia, unspecified: Secondary | ICD-10-CM

## 2023-06-19 ENCOUNTER — Telehealth: Payer: Self-pay

## 2023-06-19 LAB — LIPID PANEL
Chol/HDL Ratio: 3 {ratio} (ref 0.0–5.0)
Cholesterol, Total: 126 mg/dL (ref 100–199)
HDL: 42 mg/dL (ref >39)
LDL Chol Calc (NIH): 63 mg/dL (ref 0–99)
Triglycerides: 115 mg/dL (ref 0–149)
VLDL Cholesterol Cal: 21 mg/dL (ref 5–40)

## 2023-06-19 LAB — ALT: ALT: 40 [IU]/L (ref 0–44)

## 2023-06-19 NOTE — Telephone Encounter (Signed)
Called patient to advise lipids at goal and to continue current therapy. Results forwarded to PCP.

## 2023-06-19 NOTE — Telephone Encounter (Signed)
-----   Message from Nurse Corky Crafts sent at 06/19/2023  8:11 AM EDT -----  ----- Message ----- From: Quintella Reichert, MD Sent: 06/19/2023   7:34 AM EDT To: [facility] Triage  Lipids at goal continue current therapy and forward to PCP

## 2023-11-30 NOTE — Patient Instructions (Signed)
 SURGICAL WAITING ROOM VISITATION  Patients having surgery or a procedure may have no more than 2 support people in the waiting area - these visitors may rotate.    Children under the age of 62 must have an adult with them who is not the patient.  Due to an increase in RSV and influenza rates and associated hospitalizations, children ages 23 and under may not visit patients in [facility] hospitals.  Visitors with respiratory illnesses are discouraged from visiting and should remain at home.  If the patient needs to stay at the hospital during part of their recovery, the visitor guidelines for inpatient rooms apply. Pre-op nurse will coordinate an appropriate time for 1 support person to accompany patient in pre-op.  This support person may not rotate.    Please refer to the [facility] website for the visitor guidelines for Inpatients (after your surgery is over and you are in a regular room).       Your procedure is scheduled on:  12/17/2023    Report to [facility] Main Entrance    Report to admitting at   1115AM   Call this number if you have problems the morning of surgery [phone]   Do not eat food :After Midnight.   After Midnight you may have the following liquids until __ 1045____ AM  DAY OF SURGERY  Water Non-Citrus Juices (without pulp, NO RED-Apple, White grape, White cranberry) Black Coffee (NO MILK/CREAM OR CREAMERS, sugar ok)  Clear Tea (NO MILK/CREAM OR CREAMERS, sugar ok) regular and decaf                             Plain Jell-O (NO RED)                                           Fruit ices (not with fruit pulp, NO RED)                                     Popsicles (NO RED)                                                               Sports drinks like Gatorade (NO RED)                    The day of surgery:  Drink ONE (1) Pre-Surgery Clear Ensure or G2 at 1045 AM ( havea completed by )  the morning of surgery. Drink in one sitting. Do not sip.   This drink was given to you during your hospital  pre-op appointment visit. Nothing else to drink after completing the  Pre-Surgery Clear Ensure or G2.          If you have questions, please contact your surgeon's office.        Oral Hygiene is also important to reduce your risk of infection.  Remember - BRUSH YOUR TEETH THE MORNING OF SURGERY WITH YOUR REGULAR TOOTHPASTE  DENTURES WILL BE REMOVED PRIOR TO SURGERY PLEASE DO NOT APPLY "Poly grip" OR ADHESIVES!!!   Do NOT smoke after Midnight   Stop all vitamins and herbal supplements 7 days before surgery.   Take these medicines the morning of surgery with A SIP OF WATER:  none   DO NOT TAKE ANY ORAL DIABETIC MEDICATIONS DAY OF YOUR SURGERY  Bring CPAP mask and tubing day of surgery.                              You may not have any metal on your body including hair pins, jewelry, and body piercing             Do not wear make-up, lotions, powders, perfumes/cologne, or deodorant  Do not wear nail polish including gel and S&S, artificial/acrylic nails, or any other type of covering on natural nails including finger and toenails. If you have artificial nails, gel coating, etc. that needs to be removed by a nail salon please have this removed prior to surgery or surgery may need to be canceled/ delayed if the surgeon/ anesthesia feels like they are unable to be safely monitored.   Do not shave  48 hours prior to surgery.               Men may shave face and neck.   Do not bring valuables to the hospital. [facility] IS NOT             RESPONSIBLE   FOR VALUABLES.   Contacts, glasses, dentures or bridgework may not be worn into surgery.   Bring small overnight bag day of surgery.   DO NOT BRING YOUR HOME MEDICATIONS TO THE HOSPITAL. PHARMACY WILL DISPENSE MEDICATIONS LISTED ON YOUR MEDICATION LIST TO YOU DURING YOUR ADMISSION IN THE HOSPITAL!    Patients discharged on the day of surgery  will not be allowed to drive home.  Someone NEEDS to stay with you for the first 24 hours after anesthesia.   Special Instructions: Bring a copy of your healthcare power of attorney and living will documents the day of surgery if you haven't scanned them before.              Please read over the following fact sheets you were given: IF YOU HAVE QUESTIONS ABOUT YOUR PRE-OP INSTRUCTIONS PLEASE CALL [phone]   If you received a COVID test during your pre-op visit  it is requested that you wear a mask when out in public, stay away from anyone that may not be feeling well and notify your surgeon if you develop symptoms. If you test positive for Covid or have been in contact with anyone that has tested positive in the last 10 days please notify you surgeon.      Pre-operative 5 CHG Bath Instructions   You can play a key role in reducing the risk of infection after surgery. Your skin needs to be as free of germs as possible. You can reduce the number of germs on your skin by washing with CHG (chlorhexidine gluconate) soap before surgery. CHG is an antiseptic soap that kills germs and continues to kill germs even after washing.   DO NOT use if you have an allergy to chlorhexidine/CHG or antibacterial soaps. If your skin becomes reddened or irritated, stop using the CHG and notify one of our RNs at  [phone].   Please shower with the CHG soap starting 4 days before surgery using the following schedule:     Please keep in mind the following:  DO NOT shave, including legs and underarms, starting the day of your first shower.   You may shave your face at any point before/day of surgery.  Place clean sheets on your bed the day you start using CHG soap. Use a clean washcloth (not used since being washed) for each shower. DO NOT sleep with pets once you start using the CHG.   CHG Shower Instructions:  If you choose to wash your hair and private area, wash first with your normal shampoo/soap.   After you use shampoo/soap, rinse your hair and body thoroughly to remove shampoo/soap residue.  Turn the water OFF and apply about 3 tablespoons (45 ml) of CHG soap to a CLEAN washcloth.  Apply CHG soap ONLY FROM YOUR NECK DOWN TO YOUR TOES (washing for 3-5 minutes)  DO NOT use CHG soap on face, private areas, open wounds, or sores.  Pay special attention to the area where your surgery is being performed.  If you are having back surgery, having someone wash your back for you may be helpful. Wait 2 minutes after CHG soap is applied, then you may rinse off the CHG soap.  Pat dry with a clean towel  Put on clean clothes/pajamas   If you choose to wear lotion, please use ONLY the CHG-compatible lotions on the back of this paper.     Additional instructions for the day of surgery: DO NOT APPLY any lotions, deodorants, cologne, or perfumes.   Put on clean/comfortable clothes.  Brush your teeth.  Ask your nurse before applying any prescription medications to the skin.      CHG Compatible Lotions   Aveeno Moisturizing lotion  Cetaphil Moisturizing Cream  Cetaphil Moisturizing Lotion  Clairol Herbal Essence Moisturizing Lotion, Dry Skin  Clairol Herbal Essence Moisturizing Lotion, Extra Dry Skin  Clairol Herbal Essence Moisturizing Lotion, Normal Skin  Curel Age Defying Therapeutic Moisturizing Lotion with Alpha Hydroxy  Curel Extreme Care Body Lotion  Curel Soothing Hands Moisturizing Hand Lotion  Curel Therapeutic Moisturizing Cream, Fragrance-Free  Curel Therapeutic Moisturizing Lotion, Fragrance-Free  Curel Therapeutic Moisturizing Lotion, Original Formula  Eucerin Daily Replenishing Lotion  Eucerin Dry Skin Therapy Plus Alpha Hydroxy Crme  Eucerin Dry Skin Therapy Plus Alpha Hydroxy Lotion  Eucerin Original Crme  Eucerin Original Lotion  Eucerin Plus Crme Eucerin Plus Lotion  Eucerin TriLipid Replenishing Lotion  Keri Anti-Bacterial Hand Lotion  Keri Deep Conditioning  Original Lotion Dry Skin Formula Softly Scented  Keri Deep Conditioning Original Lotion, Fragrance Free Sensitive Skin Formula  Keri Lotion Fast Absorbing Fragrance Free Sensitive Skin Formula  Keri Lotion Fast Absorbing Softly Scented Dry Skin Formula  Keri Original Lotion  Keri Skin Renewal Lotion Keri Silky Smooth Lotion  Keri Silky Smooth Sensitive Skin Lotion  Nivea Body Creamy Conditioning Oil  Nivea Body Extra Enriched Teacher, adult education Moisturizing Lotion Nivea Crme  Nivea Skin Firming Lotion  NutraDerm 30 Skin Lotion  NutraDerm Skin Lotion  NutraDerm Therapeutic Skin Cream  NutraDerm Therapeutic Skin Lotion  ProShield Protective Hand Cream  Provon moisturizing lotion

## 2023-11-30 NOTE — Progress Notes (Addendum)
 Anesthesia Review:  PCP: Daisy Floro  Cardiologist : Armanda Magic LOV 01/19/23 The clearance  dated  04/2023 is for a colonoscopy    PPM/ ICD: Device Orders: Rep Notified:  Chest x-ray : EKG : 01/19/23  Echo : Stress test: 08/26/21  Cardiac Cath : 2004  Stent placed 2004   Activity level: can do a flight of stairs without difficutoy  Sleep Study/ CPAP : none  Fasting Blood Sugar :      / Checks Blood Sugar -- times a day:    Blood Thinner/ Instructions /Last Dose: ASA / Instructions/ Last Dose :    81 mg aspirin - pt to contact Dr Lequita Halt office for instructions.     Hearing aids

## 2023-12-04 ENCOUNTER — Other Ambulatory Visit: Payer: Self-pay

## 2023-12-04 ENCOUNTER — Encounter (HOSPITAL_COMMUNITY)
Admission: RE | Admit: 2023-12-04 | Discharge: 2023-12-04 | Disposition: A | Discharge status: Home / Self Care | Source: Ambulatory Visit | Attending: Orthopedic Surgery | Admitting: Orthopedic Surgery

## 2023-12-04 ENCOUNTER — Encounter (HOSPITAL_COMMUNITY): Payer: Self-pay

## 2023-12-04 VITALS — BP 139/87 | HR 58 | Temp 98.4°F | Resp 16 | Ht 68.0 in | Wt 209.0 lb

## 2023-12-04 DIAGNOSIS — R7989 Other specified abnormal findings of blood chemistry: Secondary | ICD-10-CM | POA: Diagnosis not present

## 2023-12-04 DIAGNOSIS — E785 Hyperlipidemia, unspecified: Secondary | ICD-10-CM | POA: Insufficient documentation

## 2023-12-04 DIAGNOSIS — Z01812 Encounter for preprocedural laboratory examination: Secondary | ICD-10-CM | POA: Diagnosis present

## 2023-12-04 DIAGNOSIS — E871 Hypo-osmolality and hyponatremia: Secondary | ICD-10-CM | POA: Insufficient documentation

## 2023-12-04 DIAGNOSIS — I2510 Atherosclerotic heart disease of native coronary artery without angina pectoris: Secondary | ICD-10-CM | POA: Insufficient documentation

## 2023-12-04 DIAGNOSIS — Z01818 Encounter for other preprocedural examination: Secondary | ICD-10-CM

## 2023-12-04 DIAGNOSIS — I10 Essential (primary) hypertension: Secondary | ICD-10-CM | POA: Diagnosis not present

## 2023-12-04 LAB — BASIC METABOLIC PANEL
Anion gap: 5 (ref 5–15)
BUN: 20 mg/dL (ref 8–23)
CO2: 25 mmol/L (ref 22–32)
Calcium: 9.4 mg/dL (ref 8.9–10.3)
Chloride: 104 mmol/L (ref 98–111)
Creatinine, Ser: 1.31 mg/dL — ABNORMAL HIGH (ref 0.61–1.24)
GFR, Estimated: 59 mL/min — ABNORMAL LOW (ref >60)
Glucose, Bld: 106 mg/dL — ABNORMAL HIGH (ref 70–99)
Potassium: 4.1 mmol/L (ref 3.5–5.1)
Sodium: 134 mmol/L — ABNORMAL LOW (ref 135–145)

## 2023-12-04 LAB — SURGICAL PCR SCREEN
MRSA, PCR: NEGATIVE
Staphylococcus aureus: NEGATIVE

## 2023-12-04 LAB — CBC
HCT: 50.1 % (ref 39.0–52.0)
Hemoglobin: 16.9 g/dL (ref 13.0–17.0)
MCH: 31.2 pg (ref 26.0–34.0)
MCHC: 33.7 g/dL (ref 30.0–36.0)
MCV: 92.6 fL (ref 80.0–100.0)
Platelets: 195 10*3/uL (ref 150–400)
RBC: 5.41 MIL/uL (ref 4.22–5.81)
RDW: 12 % (ref 11.5–15.5)
WBC: 6.6 10*3/uL (ref 4.0–10.5)
nRBC: 0 % (ref 0.0–0.2)

## 2023-12-06 NOTE — Progress Notes (Signed)
 Anesthesia Chart Review:  73 year old male follows with cardiology for history of HLD, HTN, CAD s/p PCI of the proximal RCA 2004.  Nuclear stress 08/2021 showed no ischemia and normal LV function.  He was last seen by Dr. Mayford Knife on 01/19/2023.  Noted to be doing well at that time, no anginal symptoms, no changes to management, 1 year follow-up recommended.  He was subsequently evaluated by Edd Fabian, NP on 05/01/2023 for preop clearance prior to undergoing colonoscopy.  At that time he was noted to be stable from cardiac standpoint, no concerns proceeding with procedure.  Preop labs reviewed, mild hyponatremia sodium 134, creatinine mildly elevated at 1.31, otherwise unremarkable.  EKG 01/19/2023: NSR.  Rate 63.  Nuclear stress test 08/26/2021:   The study is normal. The study is low risk.   No ST deviation was noted.   LV perfusion is normal. There is no evidence of ischemia. There is no evidence of infarction.   Left ventricular function is normal. Nuclear stress EF: 72 %. The left ventricular ejection fraction is hyperdynamic (>65%). End diastolic cavity size is normal.   Prior study not available for comparison    Marcus Greer [facility] Short Stay Center/Anesthesiology Phone [phone] 12/06/2023 2:52 PM

## 2023-12-06 NOTE — Anesthesia Preprocedure Evaluation (Addendum)
 Anesthesia Evaluation  Patient identified by MRN, date of birth, ID band Patient awake    Reviewed: Allergy & Precautions, NPO status , Patient's Chart, lab work & pertinent test results, reviewed documented beta blocker date and time   History of Anesthesia Complications Negative for: history of anesthetic complications  Airway Mallampati: III  TM Distance: >3 FB     Dental no notable dental hx.    Pulmonary neg COPD, neg PE   breath sounds clear to auscultation       Cardiovascular hypertension, + CAD, + Cardiac Stents and + DOE  (-) CABG and (-) Peripheral Vascular Disease  Rhythm:Regular Rate:Normal     Neuro/Psych neg Seizures    GI/Hepatic ,GERD  ,,(+) neg Cirrhosis        Endo/Other    Renal/GU CRFRenal disease     Musculoskeletal  (+) Arthritis , Osteoarthritis,    Abdominal   Peds  Hematology   Anesthesia Other Findings   Reproductive/Obstetrics                             Anesthesia Physical Anesthesia Plan  ASA: 2  Anesthesia Plan: Spinal   Post-op Pain Management: Regional block*   Induction: Intravenous  PONV Risk Score and Plan: 1 and Ondansetron, Dexamethasone and Propofol infusion  Airway Management Planned: Natural Airway and Simple Face Mask  Additional Equipment:   Intra-op Plan:   Post-operative Plan:   Informed Consent: I have reviewed the patients History and Physical, chart, labs and discussed the procedure including the risks, benefits and alternatives for the proposed anesthesia with the patient or authorized representative who has indicated his/her understanding and acceptance.     Dental advisory given  Plan Discussed with: CRNA  Anesthesia Plan Comments: (PAT note by Antionette Poles, PA-C: 73 year old male follows with cardiology for history of HLD, HTN, CAD s/p PCI of the proximal RCA 2004.  Nuclear stress 08/2021 showed no ischemia and  normal LV function.  He was last seen by Dr. Mayford Knife on 01/19/2023.  Noted to be doing well at that time, no anginal symptoms, no changes to management, 1 year follow-up recommended.  He was subsequently evaluated by Edd Fabian, NP on 05/01/2023 for preop clearance prior to undergoing colonoscopy.  At that time he was noted to be stable from cardiac standpoint, no concerns proceeding with procedure.  Preop labs reviewed, mild hyponatremia sodium 134, creatinine mildly elevated at 1.31, otherwise unremarkable.  EKG 01/19/2023: NSR.  Rate 63.  Nuclear stress test 08/26/2021:   The study is normal. The study is low risk.   No ST deviation was noted.   LV perfusion is normal. There is no evidence of ischemia. There is no evidence of infarction.   Left ventricular function is normal. Nuclear stress EF: 72 %. The left ventricular ejection fraction is hyperdynamic (>65%). End diastolic cavity size is normal.   Prior study not available for comparison  )        Anesthesia Quick Evaluation

## 2023-12-12 NOTE — H&P (Cosign Needed Addendum)
 H&P  Subjective:  HPI: Marcus Greer, 73 y.o. male is status post bilateral knee replacements. The right knee has been problematic, with significant stiffness and limited range of motion. He has difficulty bending the knee and can barely put on his sock. Despite efforts at the gym, he can only make it sore. He also reports increased soreness in the ligaments on the sides of his knees, especially when standing or going downhill.  He takes 1-3 Advils daily for pain relief, depending on his activity level, such as playing golf. He has tried physical therapy but has not achieved significant improvement in his range of motion.  Patient Active Problem List   Diagnosis Date Noted   Postoperative ileus (HCC) 08/10/2018   Hyponatremia 08/10/2018   Essential hypertension 08/10/2018   OA (osteoarthritis) of knee 08/05/2018   Preoperative clearance 06/26/2018   DOE (dyspnea on exertion) 10/04/2016   Coronary artery disease    Hyperlipidemia     Past Medical History:  Diagnosis Date   Cancer (HCC)    basal cell nose   Coronary artery disease    s/p PCI of the prox RCA with residual 40-50% distal RCA 11/2002   DJD (degenerative joint disease)    Essential hypertension    GERD (gastroesophageal reflux disease)    Hyperlipidemia     Past Surgical History:  Procedure Laterality Date   CARDIAC CATHETERIZATION     One cardiac stent 2002    JOINT REPLACEMENT     right total knee Alsuisio 08-05-18   KNEE ARTHROCENTESIS     NOSE SURGERY     SHOULDER ARTHROSCOPY     bone spur left   TOTAL KNEE ARTHROPLASTY Right 08/05/2018   Procedure: RIGHT TOTAL KNEE ARTHROPLASTY;  Surgeon: Ollen Gross, MD;  Location: [facility];  Service: Orthopedics;  Laterality: Right;  Adductor Block    Prior to Admission medications   Medication Sig Start Date End Date Taking? Authorizing Provider  aspirin EC 81 MG tablet Take 81 mg by mouth daily.   Yes [provider]  B Complex Vitamins (B COMPLEX PO)  Take 1 tablet by mouth daily.   Yes [provider]  Cholecalciferol (VITAMIN D3 PO) Take 1 capsule by mouth daily.   Yes [provider]  ezetimibe (ZETIA) 10 MG tablet Take 1 tablet (10 mg total) by mouth daily. 04/24/23  Yes Turner, Cornelious Bryant, MD  ibuprofen (ADVIL) 200 MG tablet Take 200 mg by mouth at bedtime as needed for moderate pain (pain score 4-6).   Yes [provider]  Magnesium Oxide 250 MG TABS Take 1 tablet by mouth daily.   Yes [provider]  ramipril (ALTACE) 5 MG capsule TAKE 1 CAPSULE BY MOUTH DAILY 05/16/23  Yes Turner, Traci R, MD  rosuvastatin (CRESTOR) 5 MG tablet Take 5 mg by mouth daily.   Yes [provider]  Turmeric 500 MG CAPS Take 500 mg by mouth daily.   Yes [provider]    Allergies  Allergen Reactions   Gemfibrozil Other (See Comments)    Cyst on eyelid   Iopamidol Hives    Pt reports that he was told after having a heart catherization that he was allergic to the contrast used, states he had hives   Niacin Er (Antihyperlipidemic) Other (See Comments)    Cyst on eyelid   Atorvastatin     Muscle pain   Tape Other (See Comments)    Irritation   Vytorin [Ezetimibe-Simvastatin]  Muscle pain    Social History   Socioeconomic History   Marital status: Married    Spouse name: Not on file   Number of children: Not on file   Years of education: Not on file   Highest education level: Not on file  Occupational History   Not on file  Tobacco Use   Smoking status: Never   Smokeless tobacco: Never  Vaping Use   Vaping status: Never Used  Substance and Sexual Activity   Alcohol use: Not Currently    Comment: occsaional   Drug use: Never   Sexual activity: Not Currently  Other Topics Concern   Not on file  Social History Narrative   Not on file   Social Drivers of Health   Financial Resource Strain: Low Risk  (11/28/2021)   Received from [facility]   Overall Financial  Resource Strain (CARDIA)    Difficulty of Paying Living Expenses: Not hard at all  Food Insecurity: No Food Insecurity (11/28/2021)   Received from [facility], [facility]   Hunger Vital Sign    Worried About Running Out of Food in the Last Year: Never true    Ran Out of Food in the Last Year: Never true  Transportation Needs: Not on file  Physical Activity: Sufficiently Active (11/28/2021)   Received from [facility], [facility]   Exercise Vital Sign    Days of Exercise per Week: 5 days    Minutes of Exercise per Session: 90 min  Stress: No Stress Concern Present (11/28/2021)   Received from [facility] - Occupational Stress Questionnaire    Feeling of Stress : Not at all  Social Connections: Unknown (12/24/2022)   Received from [facility], [facility]   Social Network    Social Network: Not on file  Intimate Partner Violence: Unknown (12/24/2022)   Received from [facility]   HITS    Physically Hurt: Not on file    Insult or Talk Down To: Not on file    Threaten Physical Harm: Not on file    Scream or Curse: Not on file    Tobacco Use: Low Risk  (12/04/2023)   Patient History    Smoking Tobacco Use: Never    Smokeless Tobacco Use: Never    Passive Exposure: Not on file   Social History   Substance and Sexual Activity  Alcohol Use Not Currently   Comment: occsaional    Family History  Problem Relation Age of Onset   Diabetes Father    Epilepsy Sister     Review of Systems  Constitutional:  Negative for chills and fever.  HENT:  Negative for congestion, sore throat and tinnitus.   Eyes:  Negative for double vision, photophobia and pain.  Respiratory:  Negative for cough, shortness of breath and wheezing.   Cardiovascular:  Negative for chest pain, palpitations and orthopnea.  Gastrointestinal:  Negative for heartburn, nausea and vomiting.  Genitourinary:  Negative for dysuria,  frequency and urgency.  Musculoskeletal:  Positive for joint pain.  Neurological:  Negative for dizziness, weakness and headaches.    Objective:  Physical Exam:  - Well-developed male, alert, oriented, no apparent distress.  - Right knee shows no warmth or effusion. Range is only 0-85 actively, 90 passively with a firm endpoint. No instability about the knee.  - Left knee shows no effusion. Range is about 5 to 110 with no tenderness or instability.  Assessment/Plan:  ASSESSMENT:  Marcus Greer is status post bilateral knee replacements. The right knee has significant stiffness and scarring, limiting his ability to utilize that knee and putting more stress on the left knee, causing some soreness. The left knee is functioning well otherwise. He is too far out to consider a closed manipulation, so an arthrotomy and scar excision would be beneficial.     PLAN: Potential risks and benefits of arthrotomy with scar excision were discussed with the patient by Dr. Lequita Halt and they elect to proceed. He will require physical therapy following surgery to regain and improve range of motion postoperatively.  Arther Abbott, PA-C Orthopedic Surgery [facility] [facility]

## 2023-12-17 ENCOUNTER — Other Ambulatory Visit: Payer: Self-pay

## 2023-12-17 ENCOUNTER — Encounter (HOSPITAL_COMMUNITY): Payer: Self-pay | Admitting: Orthopedic Surgery

## 2023-12-17 ENCOUNTER — Ambulatory Visit (HOSPITAL_COMMUNITY)
Admission: RE | Admit: 2023-12-17 | Discharge: 2023-12-17 | Disposition: A | Discharge status: Home / Self Care | Payer: Medicare Other | Source: Ambulatory Visit | Attending: Orthopedic Surgery | Admitting: Orthopedic Surgery

## 2023-12-17 ENCOUNTER — Ambulatory Visit (HOSPITAL_COMMUNITY): Payer: Self-pay | Admitting: Physician Assistant

## 2023-12-17 ENCOUNTER — Ambulatory Visit (HOSPITAL_COMMUNITY): Payer: Self-pay | Admitting: Anesthesiology

## 2023-12-17 ENCOUNTER — Encounter (HOSPITAL_COMMUNITY)
Admission: RE | Disposition: A | Discharge status: Home / Self Care | Payer: Self-pay | Source: Ambulatory Visit | Attending: Orthopedic Surgery

## 2023-12-17 DIAGNOSIS — I10 Essential (primary) hypertension: Secondary | ICD-10-CM | POA: Diagnosis not present

## 2023-12-17 DIAGNOSIS — M24661 Ankylosis, right knee: Secondary | ICD-10-CM

## 2023-12-17 DIAGNOSIS — L905 Scar conditions and fibrosis of skin: Secondary | ICD-10-CM | POA: Diagnosis not present

## 2023-12-17 DIAGNOSIS — Z01818 Encounter for other preprocedural examination: Secondary | ICD-10-CM

## 2023-12-17 DIAGNOSIS — I2510 Atherosclerotic heart disease of native coronary artery without angina pectoris: Secondary | ICD-10-CM | POA: Diagnosis not present

## 2023-12-17 DIAGNOSIS — Z96653 Presence of artificial knee joint, bilateral: Secondary | ICD-10-CM | POA: Diagnosis not present

## 2023-12-17 HISTORY — PX: KNEE ARTHROTOMY: SHX5881

## 2023-12-17 SURGERY — ARTHROTOMY, KNEE
Anesthesia: Spinal | Site: Knee | Laterality: Right

## 2023-12-17 MED ORDER — FENTANYL CITRATE PF 50 MCG/ML IJ SOSY: 25.0000 ug | PREFILLED_SYRINGE | INTRAMUSCULAR | Status: DC | PRN

## 2023-12-17 MED ORDER — BUPIVACAINE IN DEXTROSE 0.75-8.25 % IT SOLN
INTRATHECAL | Status: DC | PRN
Start: 2023-12-17 — End: 2023-12-17
  Administered 2023-12-17: 1.6 mL via INTRATHECAL

## 2023-12-17 MED ORDER — OXYCODONE HCL 5 MG/5ML PO SOLN: 5.0000 mg | Freq: Once | ORAL | Status: DC | PRN

## 2023-12-17 MED ORDER — PROPOFOL 10 MG/ML IV BOLUS
INTRAVENOUS | Status: DC | PRN
  Administered 2023-12-17: 20 mg via INTRAVENOUS

## 2023-12-17 MED ORDER — PHENYLEPHRINE HCL-NACL 20-0.9 MG/250ML-% IV SOLN
INTRAVENOUS | Status: DC | PRN
  Administered 2023-12-17: 25 ug/min via INTRAVENOUS

## 2023-12-17 MED ORDER — BUPIVACAINE LIPOSOME 1.3 % IJ SUSP: 20.0000 mL | Freq: Once | INTRAMUSCULAR | Status: DC

## 2023-12-17 MED ORDER — METHOCARBAMOL 500 MG PO TABS: 500.0000 mg | ORAL_TABLET | Freq: Four times a day (QID) | ORAL | 0 refills | Status: DC | PRN

## 2023-12-17 MED ORDER — FENTANYL CITRATE PF 50 MCG/ML IJ SOSY
PREFILLED_SYRINGE | INTRAMUSCULAR | Status: AC
  Administered 2023-12-17: 50 ug
  Filled 2023-12-17: qty 2

## 2023-12-17 MED ORDER — SODIUM CHLORIDE (PF) 0.9 % IJ SOLN
INTRAMUSCULAR | Status: AC
  Filled 2023-12-17: qty 50

## 2023-12-17 MED ORDER — EPHEDRINE 5 MG/ML INJ
INTRAVENOUS | Status: AC
  Filled 2023-12-17: qty 10

## 2023-12-17 MED ORDER — OXYCODONE HCL 5 MG PO TABS: 5.0000 mg | ORAL_TABLET | Freq: Four times a day (QID) | ORAL | 0 refills | Status: DC | PRN

## 2023-12-17 MED ORDER — FENTANYL CITRATE PF 50 MCG/ML IJ SOSY
50.0000 ug | PREFILLED_SYRINGE | INTRAMUSCULAR | Status: DC
Start: 2023-12-17 — End: 2023-12-17
  Administered 2023-12-17: 50 ug via INTRAVENOUS

## 2023-12-17 MED ORDER — BUPIVACAINE LIPOSOME 1.3 % IJ SUSP
INTRAMUSCULAR | Status: AC
  Filled 2023-12-17: qty 20

## 2023-12-17 MED ORDER — CHLORHEXIDINE GLUCONATE 0.12 % MT SOLN
15.0000 mL | Freq: Once | OROMUCOSAL | Status: AC
  Administered 2023-12-17: 15 mL via OROMUCOSAL

## 2023-12-17 MED ORDER — OXYCODONE HCL 5 MG PO TABS: 5.0000 mg | ORAL_TABLET | Freq: Once | ORAL | Status: DC | PRN

## 2023-12-17 MED ORDER — ONDANSETRON HCL 4 MG/2ML IJ SOLN
INTRAMUSCULAR | Status: DC | PRN
  Administered 2023-12-17: 4 mg via INTRAVENOUS

## 2023-12-17 MED ORDER — SODIUM CHLORIDE (PF) 0.9 % IJ SOLN
INTRAMUSCULAR | Status: AC
Start: 2023-12-17 — End: ?
  Filled 2023-12-17: qty 10

## 2023-12-17 MED ORDER — POVIDONE-IODINE 10 % EX SWAB: 2.0000 | Freq: Once | CUTANEOUS | Status: DC

## 2023-12-17 MED ORDER — PROPOFOL 500 MG/50ML IV EMUL
INTRAVENOUS | Status: DC | PRN
  Administered 2023-12-17: 75 ug/kg/min via INTRAVENOUS

## 2023-12-17 MED ORDER — ACETAMINOPHEN 10 MG/ML IV SOLN: 1000.0000 mg | Freq: Once | INTRAVENOUS | Status: DC | PRN

## 2023-12-17 MED ORDER — DEXAMETHASONE SODIUM PHOSPHATE 10 MG/ML IJ SOLN: 8.0000 mg | Freq: Once | INTRAMUSCULAR | Status: DC

## 2023-12-17 MED ORDER — SODIUM CHLORIDE 0.9 % IR SOLN
Status: DC | PRN
  Administered 2023-12-17: 1000 mL

## 2023-12-17 MED ORDER — DEXAMETHASONE SODIUM PHOSPHATE 10 MG/ML IJ SOLN
INTRAMUSCULAR | Status: DC | PRN
  Administered 2023-12-17: 8 mg via INTRAVENOUS

## 2023-12-17 MED ORDER — ACETAMINOPHEN 10 MG/ML IV SOLN
1000.0000 mg | Freq: Four times a day (QID) | INTRAVENOUS | Status: DC
  Administered 2023-12-17: 1000 mg via INTRAVENOUS
  Filled 2023-12-17: qty 100

## 2023-12-17 MED ORDER — ONDANSETRON HCL 4 MG/2ML IJ SOLN: 4.0000 mg | Freq: Once | INTRAMUSCULAR | Status: DC | PRN

## 2023-12-17 MED ORDER — 0.9 % SODIUM CHLORIDE (POUR BTL) OPTIME
TOPICAL | Status: DC | PRN
  Administered 2023-12-17: 1000 mL

## 2023-12-17 MED ORDER — LIDOCAINE HCL (CARDIAC) PF 100 MG/5ML IV SOSY
PREFILLED_SYRINGE | INTRAVENOUS | Status: DC | PRN
Start: 2023-12-17 — End: 2023-12-17
  Administered 2023-12-17: 50 mg via INTRAVENOUS

## 2023-12-17 MED ORDER — LACTATED RINGERS IV SOLN
INTRAVENOUS | Status: DC
Start: 2023-12-17 — End: 2023-12-17

## 2023-12-17 MED ORDER — CEFAZOLIN SODIUM-DEXTROSE 2-4 GM/100ML-% IV SOLN
2.0000 g | INTRAVENOUS | Status: AC
  Administered 2023-12-17: 2 g via INTRAVENOUS
  Filled 2023-12-17: qty 100

## 2023-12-17 MED ORDER — ORAL CARE MOUTH RINSE: 15.0000 mL | Freq: Once | OROMUCOSAL | Status: AC

## 2023-12-17 MED ORDER — LACTATED RINGERS IV SOLN: INTRAVENOUS | Status: DC

## 2023-12-17 SURGICAL SUPPLY — 30 items
BAG COUNTER SPONGE SURGICOUNT (BAG) IMPLANT
BAG ZIPLOCK 12X15 (MISCELLANEOUS) ×1 IMPLANT
BNDG ELASTIC 6INX 5YD STR LF (GAUZE/BANDAGES/DRESSINGS) ×1 IMPLANT
COVER SURGICAL LIGHT HANDLE (MISCELLANEOUS) ×1 IMPLANT
CUFF TRNQT CYL 34X4.125X (TOURNIQUET CUFF) IMPLANT
DERMABOND ADVANCED .7 DNX12 (GAUZE/BANDAGES/DRESSINGS) IMPLANT
DRAPE U-SHAPE 47X51 STRL (DRAPES) ×1 IMPLANT
DRSG AQUACEL AG ADV 3.5X10 (GAUZE/BANDAGES/DRESSINGS) ×1 IMPLANT
DURAPREP 26ML APPLICATOR (WOUND CARE) ×1 IMPLANT
ELECT REM PT RETURN 15FT ADLT (MISCELLANEOUS) ×1 IMPLANT
GLOVE BIO SURGEON STRL SZ 6.5 (GLOVE) ×2 IMPLANT
GLOVE BIO SURGEON STRL SZ7 (GLOVE) ×1 IMPLANT
GLOVE BIO SURGEON STRL SZ8 (GLOVE) ×2 IMPLANT
GLOVE BIOGEL PI IND STRL 7.0 (GLOVE) ×2 IMPLANT
GLOVE BIOGEL PI IND STRL 8 (GLOVE) ×3 IMPLANT
GOWN STRL REUS W/ TWL LRG LVL3 (GOWN DISPOSABLE) ×3 IMPLANT
IMMOBILIZER KNEE 20 (SOFTGOODS) ×1 IMPLANT
IMMOBILIZER KNEE 20 THIGH 36 (SOFTGOODS) IMPLANT
KIT TURNOVER KIT A (KITS) IMPLANT
MANIFOLD NEPTUNE II (INSTRUMENTS) ×1 IMPLANT
PACK TOTAL KNEE CUSTOM (KITS) ×1 IMPLANT
PADDING CAST COTTON 6X4 STRL (CAST SUPPLIES) IMPLANT
PROTECTOR NERVE ULNAR (MISCELLANEOUS) ×1 IMPLANT
STRIP CLOSURE SKIN 1/2X4 (GAUZE/BANDAGES/DRESSINGS) ×2 IMPLANT
SUT MNCRL AB 4-0 PS2 18 (SUTURE) ×1 IMPLANT
SUT STRATAFIX 0 PDS 27 VIOLET (SUTURE) ×1 IMPLANT
SUT VIC AB 2-0 CT1 TAPERPNT 27 (SUTURE) ×2 IMPLANT
SUTURE STRATFX 0 PDS 27 VIOLET (SUTURE) ×1 IMPLANT
TRAY FOLEY MTR SLVR 16FR STAT (SET/KITS/TRAYS/PACK) IMPLANT
WRAP KNEE MAXI GEL POST OP (GAUZE/BANDAGES/DRESSINGS) IMPLANT

## 2023-12-17 NOTE — Brief Op Note (Signed)
 12/17/2023  2:01 PM  PATIENT:  Marcus Greer  73 y.o. male  PRE-OPERATIVE DIAGNOSIS:  Right Knee arthro fibrosis  POST-OPERATIVE DIAGNOSIS:  Right Knee arthro fibrosis  PROCEDURE:  Procedure(s) with comments: Knee open arthrotomy-scar excision (Right) - 60 min  SURGEON:  Surgeons and Role:    Ollen Gross, MD - Primary  PHYSICIAN ASSISTANT:   ASSISTANTS: Arther Abbott, PA-C   ANESTHESIA:   spinal  EBL: 5 ml  BLOOD ADMINISTERED:none  DRAINS: none   LOCAL MEDICATIONS USED:  NONE  COUNTS:  YES  TOURNIQUET:   Total Tourniquet Time Documented: Thigh (Right) - 14 minutes Total: Thigh (Right) - 14 minutes   DICTATION: .Other Dictation: Dictation Number [id number]  PLAN OF CARE: Discharge to home after PACU  PATIENT DISPOSITION:  PACU - hemodynamically stable.

## 2023-12-17 NOTE — Anesthesia Postprocedure Evaluation (Signed)
 Anesthesia Post Note  Patient: Calem A Mauck  Procedure(s) Performed: Knee open arthrotomy-scar excision (Right: Knee)     Patient location during evaluation: PACU Anesthesia Type: Spinal Level of consciousness: awake and alert Pain management: pain level controlled Vital Signs Assessment: post-procedure vital signs reviewed and stable Respiratory status: spontaneous breathing, nonlabored ventilation, respiratory function stable and patient connected to nasal cannula oxygen Cardiovascular status: blood pressure returned to baseline and stable Postop Assessment: no apparent nausea or vomiting Anesthetic complications: no   No notable events documented.  Last Vitals:  Vitals:   12/17/23 1645 12/17/23 1652  BP: 120/80 125/88  Pulse: (!) 54 (!) 45  Resp: 16   Temp:    SpO2: 98% 100%    Last Pain:  Vitals:   12/17/23 1652  TempSrc:   PainSc: 0-No pain                 Mariann Barter

## 2023-12-17 NOTE — Discharge Instructions (Addendum)
 Ollen Gross, MD [facility] Specialist [facility] [address] [place], [place] [address] [phone]   POSTOPERATIVE DIRECTIONS  Knee Rehabilitation, Guidelines Following Surgery  Results after knee surgery are often greatly improved when you follow the exercise, range of motion and muscle strengthening exercises prescribed by your doctor. Safety measures are also important to protect the knee from further injury. If any of these exercises cause you to have increased pain or swelling in your knee joint, decrease the amount until you are comfortable again and slowly increase them. If you have problems or questions, call your caregiver or physical therapist for advice.   HOME CARE INSTRUCTIONS  Remove items at home which could result in a fall. This includes throw rugs or furniture in walking pathways.  ICE to the affected knee as much as tolerated. Icing helps control swelling. If the swelling is well controlled you will be more comfortable and rehab easier. Continue to use ice on the knee for pain and swelling from surgery. You may notice swelling that will progress down to the foot and ankle. This is normal after surgery. Elevate the leg when you are not up walking on it.    Continue to use the breathing machine which will help keep your temperature down. It is common for your temperature to cycle up and down following surgery, especially at night when you are not up moving around and exerting yourself. The breathing machine keeps your lungs expanded and your temperature down. Do not place pillow under the operative knee, focus on keeping the knee straight while resting  DIET You may resume your previous home diet once you are discharged from the hospital.  DRESSING / WOUND CARE / SHOWERING Keep your bulky bandage on for 2 days. On the third post-operative day you may remove the Ace bandage and gauze. There is a waterproof adhesive bandage on your skin  which will stay in place until your first follow-up appointment. Once you remove this you will not need to place another bandage You may begin showering 3 days following surgery, but do not submerge the incision under water.  ACTIVITY For the first 5 days, the key is rest and control of pain and swelling Do your home exercises twice a day starting on post-operative day 3. On the days you go to physical therapy, just do the home exercises once that day. You should rest, ice and elevate the leg for 50 minutes out of every hour. Get up and walk/stretch for 10 minutes per hour. After 5 days you can increase your activity slowly as tolerated. Avoid periods of inactivity such as sitting longer than an hour when not asleep. This helps prevent blood clots.  You may discontinue the knee immobilizer once you are able to perform a straight leg raise while lying down. You may resume a sexual relationship in one month or when given the OK by your doctor.  You may return to work once you are cleared by your doctor.  Do not drive a car for 6 weeks or until released by your surgeon.  Do not drive while taking narcotics.  TED HOSE STOCKINGS Wear the elastic stockings on both legs for three weeks following surgery during the day. You may remove them at night for sleeping.  WEIGHT BEARING Weight bearing as tolerated with assist device (walker, cane, etc) as directed, use it as long as suggested by your surgeon or therapist, typically at least 4-6 weeks.  POSTOPERATIVE CONSTIPATION PROTOCOL Constipation -  defined medically as fewer than three stools per week and severe constipation as less than one stool per week.  One of the most common issues patients have following surgery is constipation.  Even if you have a regular bowel pattern at home, your normal regimen is likely to be disrupted due to multiple reasons following surgery.  Combination of anesthesia, postoperative narcotics, change in appetite and fluid  intake all can affect your bowels.  In order to avoid complications following surgery, here are some recommendations in order to help you during your recovery period.  Colace (docusate) - Pick up an over-the-counter form of Colace or another stool softener and take twice a day as long as you are requiring postoperative pain medications.  Take with a full glass of water daily.  If you experience loose stools or diarrhea, hold the colace until you stool forms back up. If your symptoms do not get better within 1 week or if they get worse, check with your doctor. Dulcolax (bisacodyl) - Pick up over-the-counter and take as directed by the product packaging as needed to assist with the movement of your bowels.  Take with a full glass of water.  Use this product as needed if not relieved by Colace only.  MiraLax (polyethylene glycol) - Pick up over-the-counter to have on hand. MiraLax is a solution that will increase the amount of water in your bowels to assist with bowel movements.  Take as directed and can mix with a glass of water, juice, soda, coffee, or tea. Take if you go more than two days without a movement. Do not use MiraLax more than once per day. Call your doctor if you are still constipated or irregular after using this medication for 7 days in a row.  If you continue to have problems with postoperative constipation, please contact the office for further assistance and recommendations.  If you experience "the worst abdominal pain ever" or develop nausea or vomiting, please contact the office immediatly for further recommendations for treatment.  ITCHING If you experience itching with your medications, try taking only a single pain pill, or even half a pain pill at a time.  You can also use Benadryl over the counter for itching or also to help with sleep.   MEDICATIONS See your medication summary on the "After Visit Summary" that the nursing staff will review with you prior to discharge.  You may  have some home medications which will be placed on hold until you complete the course of blood thinner medication.  It is important for you to complete the blood thinner medication as prescribed by your surgeon.  Continue your approved medications as instructed at time of discharge.  PRECAUTIONS If you experience chest pain or shortness of breath - call 911 immediately for transfer to the hospital emergency department.  If you develop a fever greater that 101 F, purulent drainage from wound, increased redness or drainage from wound, foul odor from the wound/dressing, or calf pain - CONTACT YOUR SURGEON.                                                   FOLLOW-UP APPOINTMENTS Make sure you keep all of your appointments after your operation with your surgeon and caregivers. You should call the office at the above phone number and make an appointment for approximately two  weeks after the date of your surgery or on the date instructed by your surgeon outlined in the "After Visit Summary".  RANGE OF MOTION AND STRENGTHENING EXERCISES  Rehabilitation of the knee is important following a knee injury or an operation. After just a few days of immobilization, the muscles of the thigh which control the knee become weakened and shrink (atrophy). Knee exercises are designed to build up the tone and strength of the thigh muscles and to improve knee motion. Often times heat used for twenty to thirty minutes before working out will loosen up your tissues and help with improving the range of motion but do not use heat for the first two weeks following surgery. These exercises can be done on a training (exercise) mat, on the floor, on a table or on a bed. Use what ever works the best and is most comfortable for you Knee exercises include:  Leg Lifts - While your knee is still immobilized in a splint or cast, you can do straight leg raises. Lift the leg to 60 degrees, hold for 3 sec, and slowly lower the leg. Repeat 10-20  times 2-3 times daily. Perform this exercise against resistance later as your knee gets better.  Quad and Hamstring Sets - Tighten up the muscle on the front of the thigh (Quad) and hold for 5-10 sec. Repeat this 10-20 times hourly. Hamstring sets are done by pushing the foot backward against an object and holding for 5-10 sec. Repeat as with quad sets.  Leg Slides: Lying on your back, slowly slide your foot toward your buttocks, bending your knee up off the floor (only go as far as is comfortable). Then slowly slide your foot back down until your leg is flat on the floor again. Angel Wings: Lying on your back spread your legs to the side as far apart as you can without causing discomfort.  A rehabilitation program following serious knee injuries can speed recovery and prevent re-injury in the future due to weakened muscles. Contact your doctor or a physical therapist for more information on knee rehabilitation.   POST-OPERATIVE OPIOID TAPER INSTRUCTIONS: It is important to wean off of your opioid medication as soon as possible. If you do not need pain medication after your surgery it is ok to stop day one. Opioids include: Codeine, Hydrocodone(Norco, Vicodin), Oxycodone(Percocet, oxycontin) and hydromorphone amongst others.  Long term and even short term use of opiods can cause: Increased pain response Dependence Constipation Depression Respiratory depression And more.  Withdrawal symptoms can include Flu like symptoms Nausea, vomiting And more Techniques to manage these symptoms Hydrate well Eat regular healthy meals Stay active Use relaxation techniques(deep breathing, meditating, yoga) Do Not substitute Alcohol to help with tapering If you have been on opioids for less than two weeks and do not have pain than it is ok to stop all together.  Plan to wean off of opioids This plan should start within one week post op of your joint replacement. Maintain the same interval or time between  taking each dose and first decrease the dose.  Cut the total daily intake of opioids by one tablet each day Next start to increase the time between doses. The last dose that should be eliminated is the evening dose.   IF YOU ARE TRANSFERRED TO A SKILLED REHAB FACILITY If the patient is transferred to a skilled rehab facility following release from the hospital, a list of the current medications will be sent to the facility for the patient to  continue.  When discharged from the skilled rehab facility, please have the facility set up the patient's Home Health Physical Therapy prior to being released. Also, the skilled facility will be responsible for providing the patient with their medications at time of release from the facility to include their pain medication, the muscle relaxants, and their blood thinner medication. If the patient is still at the rehab facility at time of the two week follow up appointment, the skilled rehab facility will also need to assist the patient in arranging follow up appointment in our office and any transportation needs.  MAKE SURE YOU:  Understand these instructions.  Get help right away if you are not doing well or get worse.   DENTAL ANTIBIOTICS:  In most cases prophylactic antibiotics for Dental procdeures after total joint surgery are not necessary.  Exceptions are as follows:  1. History of prior total joint infection  2. Severely immunocompromised (Organ Transplant, cancer chemotherapy, Rheumatoid biologic meds such as Humera)  3. Poorly controlled diabetes (A1C &gt; 8.0, blood glucose over 200)  If you have one of these conditions, contact your surgeon for an antibiotic prescription, prior to your dental procedure.    Pick up stool softner and laxative for home use following surgery while on pain medications. Do not submerge incision under water. Please use good hand washing techniques while changing dressing each day. May shower starting three  days after surgery. Please use a clean towel to pat the incision dry following showers. Continue to use ice for pain and swelling after surgery. Do not use any lotions or creams on the incision until instructed by your surgeon.

## 2023-12-17 NOTE — Interval H&P Note (Signed)
 History and Physical Interval Note:  12/17/2023 11:30 AM  Marcus Greer  has presented today for surgery, with the diagnosis of Right Knee arthro fibrosis.  The various methods of treatment have been discussed with the patient and family. After consideration of risks, benefits and other options for treatment, the patient has consented to  Procedure(s) with comments: Knee open arthrotomy-scar excision (Right) - 60 min as a surgical intervention.  The patient's history has been reviewed, patient examined, no change in status, stable for surgery.  I have reviewed the patient's chart and labs.  Questions were answered to the patient's satisfaction.     Homero Fellers [name]

## 2023-12-17 NOTE — Transfer of Care (Signed)
 Immediate Anesthesia Transfer of Care Note  Patient: Marcus Greer  Procedure(s) Performed: Procedure(s) with comments: Knee open arthrotomy-scar excision (Right) - 60 min  Patient Location: PACU  Anesthesia Type:Spinal  Level of Consciousness:  sedated, patient cooperative and responds to stimulation  Airway & Oxygen Therapy:Patient Spontanous Breathing and Patient connected to face mask oxgen  Post-op Assessment:  Report given to PACU RN and Post -op Vital signs reviewed and stable  Post vital signs:  Reviewed and stable  Last Vitals:  Vitals:   12/17/23 1249 12/17/23 1420  BP: 124/76 (!) 99/53  Pulse:  (!) 54  Resp:    Temp:  36.7 C  SpO2:  99%    Complications: No apparent anesthesia complications

## 2023-12-18 ENCOUNTER — Encounter (HOSPITAL_COMMUNITY): Payer: Self-pay | Admitting: Orthopedic Surgery

## 2023-12-18 NOTE — Op Note (Unsigned)
 NAME: Carder, Kelan A. MEDICAL RECORD NO: [id number] ACCOUNT NO: [id number] DATE OF BIRTH: 1951/02/11 FACILITY: [facility] LOCATION: [facility] PHYSICIAN: Gus Rankin. Matheus Spiker, MD  Operative Report   DATE OF PROCEDURE: 12/17/2023  PREOPERATIVE DIAGNOSIS:  Arthrofibrosis, right knee post total knee arthroplasty.  POSTOPERATIVE DIAGNOSIS:  Arthrofibrosis, right knee post total knee arthroplasty.  PROCEDURE PERFORMED:  Right knee open arthrotomy and scar excision.  SURGEON:  Gus Rankin. Juliene Kirsh, MD  ASSISTANT:  Julieta Gutting, PA-C  ANESTHESIA:  Spinal.  ESTIMATED BLOOD LOSS:  5 mL.  DRAINS:  None.  TOURNIQUET TIME:  14 minutes at 300 mmHg.  CONDITION:  Stable to recovery.  BRIEF CLINICAL NOTE:  The patient is a 73 year old male who had a right total knee arthroplasty done several years ago.  He was seen several months ago in the office with very restricted motion of the knee.  He had not been exercising and subsequently,  lost a significant amount of motion where he could only flex to 80 degrees and had a 10-degree flexion contracture.  He was not having pain in the knee.  I felt that the best option for him to attempt to regain more motion was to do this arthrotomy and  scar excision.  Physical therapy was not indicated due to the amount of stiffness.  DESCRIPTION OF PROCEDURE:  After successful administration of spinal anesthetic, a tourniquet was placed on the right thigh.  The right lower extremity was prepped and draped in the usual sterile fashion.  Exam under anesthesia showed range of motion of  10 to 80 degrees.  I then wrapped the leg in an Esmarch and inflated the tourniquet to 300 mmHg.  Midline incision was made with a 10 blade through subcutaneous tissue to the level of the extensor mechanism.  A fresh blade was used to make a medial  parapatellar arthrotomy.  Soft tissue of the proximal medial tibia was subperiosteally elevated to the joint line with the knife and to the  semimembranosus bursa with a Cobb elevator.  The soft tissue laterally was elevated with attention being paid to  avoid the patellar tendon on the tibial tubercle.  I then thoroughly released the scar subcutaneously and then excised the scar from under the extensor mechanism starting medially.  We then did this superiorly and laterally.  He had his components in  good position with no abnormalities of the components.  Once I had cleared out all the scar tissue, we were able to flex the knee up to 120 with the joint open.  We then thoroughly irrigated again and closed the arthrotomy with a running 0 Stratafix  suture.  Flexion against gravity was approximately 110 degrees.  The patella tracked normally.  Tourniquet was released.  Total tourniquet time of 14 minutes.  Minor bleeding was stopped with electrocautery.  The subcu was closed with interrupted 2-0  Vicryl. Subcuticular was closed with running 4-0 Monocryl, and then the incisions cleaned and dried and Dermabond and sterile dressing applied.  He is subsequently awakened and transported to the recovery room in stable condition.    MUK D: 12/17/2023 2:07:40 pm T: 12/18/2023 12:11:00 am  JOB: [id number]/ [id number]

## 2024-01-27 ENCOUNTER — Other Ambulatory Visit: Payer: Self-pay | Admitting: Cardiology

## 2024-01-28 ENCOUNTER — Other Ambulatory Visit: Payer: Self-pay

## 2024-01-28 MED ORDER — ROSUVASTATIN CALCIUM 5 MG PO TABS: 5.0000 mg | ORAL_TABLET | Freq: Every day | ORAL | 0 refills | Status: DC

## 2024-02-13 ENCOUNTER — Other Ambulatory Visit: Payer: Self-pay | Admitting: Cardiology

## 2024-03-10 ENCOUNTER — Other Ambulatory Visit: Payer: Self-pay | Admitting: Cardiology

## 2024-04-20 ENCOUNTER — Other Ambulatory Visit: Payer: Self-pay | Admitting: Cardiology

## 2024-04-28 ENCOUNTER — Telehealth: Payer: Self-pay | Admitting: Cardiology

## 2024-04-28 NOTE — Telephone Encounter (Signed)
*  STAT* If patient is at the pharmacy, call can be transferred to refill team.   1. Which medications need to be refilled? (please list name of each medication and dose if known) rosuvastatin  (CRESTOR ) 5 MG tablet   2. Which pharmacy/location (including street and city if local pharmacy) is medication to be sent to?  3. [id number] - [place], [place] - [address] Phone: [phone]  Fax: [phone]    o they need a 30 day or 90 day supply? 90 Pt made appt with Dr Dorine 1st available on 07/24/24 please refill til then

## 2024-04-29 MED ORDER — ROSUVASTATIN CALCIUM 5 MG PO TABS: 5.0000 mg | ORAL_TABLET | Freq: Every day | ORAL | 1 refills | Status: DC

## 2024-04-29 NOTE — Telephone Encounter (Signed)
 Refill sent in for Rosuvastatin  to [facility].

## 2024-05-31 ENCOUNTER — Other Ambulatory Visit: Payer: Self-pay | Admitting: Cardiology

## 2024-06-12 ENCOUNTER — Other Ambulatory Visit: Payer: Self-pay | Admitting: Cardiology

## 2024-07-14 ENCOUNTER — Ambulatory Visit (INDEPENDENT_AMBULATORY_CARE_PROVIDER_SITE_OTHER): Admitting: Podiatry

## 2024-07-14 ENCOUNTER — Encounter: Payer: Self-pay | Admitting: Podiatry

## 2024-07-14 ENCOUNTER — Ambulatory Visit (INDEPENDENT_AMBULATORY_CARE_PROVIDER_SITE_OTHER)

## 2024-07-14 DIAGNOSIS — M7752 Other enthesopathy of left foot: Secondary | ICD-10-CM | POA: Diagnosis not present

## 2024-07-14 DIAGNOSIS — M778 Other enthesopathies, not elsewhere classified: Secondary | ICD-10-CM

## 2024-07-14 DIAGNOSIS — M7751 Other enthesopathy of right foot: Secondary | ICD-10-CM

## 2024-07-14 DIAGNOSIS — G629 Polyneuropathy, unspecified: Secondary | ICD-10-CM

## 2024-07-14 MED ORDER — TRIAMCINOLONE ACETONIDE 10 MG/ML IJ SUSP
10.0000 mg | Freq: Once | INTRAMUSCULAR | Status: AC
  Administered 2024-07-14: 10 mg via INTRA_ARTICULAR

## 2024-07-14 NOTE — Progress Notes (Signed)
 Subjective:   Patient ID: Marcus Greer, male   DOB: 73 y.o.   MRN: [id number]   HPI Patient presents stating he has had a lot of pain in his feet and ankles and he gets a lot of burning pain at nighttime.  States he does not remember specific injury but it has gradually become more of an issue for him and he likes to be active but is not able to the degree he wants to.  Patient is not a diabetic currently and does not smoke likes to be active   Review of Systems  All other systems reviewed and are negative.       Objective:  Physical Exam Vitals and nursing note reviewed.  Constitutional:      Appearance: He is well-developed.  Pulmonary:     Effort: Pulmonary effort is normal.  Musculoskeletal:        General: Normal range of motion.  Skin:    General: Skin is warm.  Neurological:     Mental Status: He is alert.     Neurovascular status intact muscle strength adequate range of motion adequate with patient noted to have mild reduction of pulses DP PT intact bilateral neurologically moderate reduction of sharp dull vibratory.  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  Reappoint 1 month to reevaluate and decide what might be necessary  X-rays indicate no signs of arthritis or other inflammatory condition

## 2024-07-24 ENCOUNTER — Ambulatory Visit: Attending: Cardiology | Admitting: Cardiology

## 2024-07-24 ENCOUNTER — Encounter: Payer: Self-pay | Admitting: Cardiology

## 2024-07-24 VITALS — BP 126/76 | HR 65 | Ht 68.0 in | Wt 223.4 lb

## 2024-07-24 DIAGNOSIS — E7800 Pure hypercholesterolemia, unspecified: Secondary | ICD-10-CM | POA: Diagnosis present

## 2024-07-24 DIAGNOSIS — I2510 Atherosclerotic heart disease of native coronary artery without angina pectoris: Secondary | ICD-10-CM | POA: Diagnosis not present

## 2024-07-24 DIAGNOSIS — Z79899 Other long term (current) drug therapy: Secondary | ICD-10-CM | POA: Diagnosis present

## 2024-07-24 DIAGNOSIS — I10 Essential (primary) hypertension: Secondary | ICD-10-CM | POA: Diagnosis present

## 2024-07-24 MED ORDER — EZETIMIBE 10 MG PO TABS: 10.0000 mg | ORAL_TABLET | Freq: Every day | ORAL | 3 refills | Status: AC

## 2024-07-24 MED ORDER — ROSUVASTATIN CALCIUM 5 MG PO TABS
5.0000 mg | ORAL_TABLET | Freq: Every day | ORAL | 3 refills | Status: AC
Start: 2024-07-24 — End: ?

## 2024-07-24 MED ORDER — RAMIPRIL 5 MG PO CAPS: 5.0000 mg | ORAL_CAPSULE | Freq: Every day | ORAL | 3 refills | Status: AC

## 2024-07-24 NOTE — Progress Notes (Signed)
 Date:  07/24/2024   ID:  Marcus Greer, DOB June 25, 1951, MRN [id number]  PCP:  Okey Carlin Redbird, MD  Cardiologist:  Wilbert Bihari, MD  Electrophysiologist:  None   Chief Complaint:  CAD, lipids  History of Present Illness:    Marcus Greer is a 73 y.o. male with a hx of ASCAD S/P PCI of the prox RCA with residual 40-50% distal RCA, dyslipidemia and GERD .  He is here today for follow-up and is going well.  He denies any chest pain or pressure, SOB, DOE, PND, orthopnea, dizziness, palpitations or syncope.  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 He is very active doing yard work and if it was very hot outside he would notice his heart rate increase but that resolved when the weather cooled off.  He has noticed some tightness in the chest when he exercises which is unchanged over the past 20 years since his heart incident.  He only  notices it when he is pushing himself exercising and eases off with rest.  This has not change any since the stress test he did in 2022.  Prior CV studies:   The following studies were reviewed today: EKG  Past Medical History:  Diagnosis Date   Cancer (HCC)    basal cell nose   Coronary artery disease    s/p PCI of the prox RCA with residual 40-50% distal RCA 11/2002   DJD (degenerative joint disease)    Essential hypertension    GERD (gastroesophageal reflux disease)    Hyperlipidemia    Past Surgical History:  Procedure Laterality Date   CARDIAC CATHETERIZATION     One cardiac stent 2002    JOINT REPLACEMENT     right total knee Alsuisio 08-05-18   KNEE ARTHROCENTESIS     KNEE ARTHROTOMY Right 12/17/2023   Procedure: Knee open arthrotomy-scar excision;  Surgeon: Melodi Lerner, MD;  Location: [facility];  Service: Orthopedics;  Laterality: Right;  60 min   NOSE SURGERY     SHOULDER ARTHROSCOPY     bone spur left   TOTAL KNEE ARTHROPLASTY Right 08/05/2018   Procedure: RIGHT TOTAL KNEE ARTHROPLASTY;  Surgeon: Melodi Lerner, MD;   Location: [facility];  Service: Orthopedics;  Laterality: Right;  Adductor Block     Current Meds  Medication Sig   aspirin  EC 81 MG tablet Take 81 mg by mouth daily.   B Complex Vitamins (B COMPLEX PO) Take 1 tablet by mouth daily.   Cholecalciferol (VITAMIN D3 PO) Take 1 capsule by mouth daily.   ezetimibe  (ZETIA ) 10 MG tablet TAKE 1 TABLET BY MOUTH DAILY.   ibuprofen (ADVIL) 200 MG tablet Take 200 mg by mouth at bedtime as needed for moderate pain (pain score 4-6).   Magnesium Oxide 250 MG TABS Take 1 tablet by mouth daily.   ramipril  (ALTACE ) 5 MG capsule Take 1 capsule (5 mg total) by mouth daily. Please keep scheduled appointment for future refills. Thank you.   rosuvastatin  (CRESTOR ) 5 MG tablet Take 1 tablet (5 mg total) by mouth daily.   Turmeric 500 MG CAPS Take 500 mg by mouth daily.     Allergies:   Gemfibrozil, Iopamidol , Niacin er (antihyperlipidemic), Atorvastatin , Tape, and Vytorin  [ezetimibe -simvastatin ]   Social History   Tobacco Use   Smoking status: Never   Smokeless tobacco: Never  Vaping Use   Vaping status: Never Used  Substance Use Topics   Alcohol use: Not Currently    Comment: occsaional  Drug use: Never     Family Hx: The patient's family history includes Diabetes in his father; Epilepsy in his sister.  ROS:   Please see the history of present illness.     All other systems reviewed and are negative.   Labs/Other Tests and Data Reviewed:    Recent Labs: 12/04/2023: BUN 20; Creatinine, Ser 1.31; Hemoglobin 16.9; Platelets 195; Potassium 4.1; Sodium 134   Recent Lipid Panel Lab Results  Component Value Date/Time   CHOL 126 06/18/2023 08:18 AM   CHOL 124 07/25/2013 07:50 AM   TRIG 115 06/18/2023 08:18 AM   TRIG 157 (H) 07/25/2013 07:50 AM   HDL 42 06/18/2023 08:18 AM   HDL 41 07/25/2013 07:50 AM   CHOLHDL 3.0 06/18/2023 08:18 AM   CHOLHDL 3.6 08/16/2015 07:54 AM   LDLCALC 63 06/18/2023 08:18 AM   LDLCALC 52 07/25/2013 07:50 AM    Wt  Readings from Last 3 Encounters:  07/24/24 223 lb 6.4 oz (101.3 kg)  12/17/23 207 lb 3.7 oz (94 kg)  12/04/23 209 lb (94.8 kg)    EKG Interpretation Date/Time:  Thursday July 24 2024 09:51:34 EST Ventricular Rate:  65 PR Interval:  200 QRS Duration:  96 QT Interval:  394 QTC Calculation: 409 R Axis:   1  Text Interpretation: Normal sinus rhythm Incomplete right bundle branch block When compared with ECG of 13-Nov-2002 04:11, Incomplete right bundle branch block is now present Confirmed by Shlomo Corning ([id number]) on 07/24/2024 10:04:31 AM    Objective:    Vital Signs:  BP 126/76   Pulse 65   Ht 5' 8 (1.727 m)   Wt 223 lb 6.4 oz (101.3 kg)   SpO2 97%   BMI 33.97 kg/m   GEN: Well nourished, well developed in no acute distress HEENT: Normal NECK: No JVD; No carotid bruits LYMPHATICS: No lymphadenopathy CARDIAC:RRR, no murmurs, rubs, gallops RESPIRATORY:  Clear to auscultation without rales, wheezing or rhonchi  ABDOMEN: Soft, non-tender, non-distended MUSCULOSKELETAL:  No edema; No deformity  SKIN: Warm and dry NEUROLOGIC:  Alert and oriented x 3 PSYCHIATRIC:  Normal affect  ASSESSMENT & PLAN:    1.  ASCAD  -S/P PCI of the prox RCA with residual 40-50% distal RCA in 2002.   -Nuclear stress test 08/30/2021 showed no ischemia and normal LV function -He has not had any anginal symptoms since I saw him a year ago unless he pushes himself with working out and that has been occurring for over 20 years and says that it is completely stable.  He never gets it with ADLs but sometimes gets it when he is outside working in the heat. -Continue aspirin  81 mg daily, Zetia  10 mg daily and rosuvastatin  5 mg daily with as needed refills -I have recommended doing a Stress PET CT to rule out ischemia -Informed Consent   Shared Decision Making/Informed Consent The risks [chest pain, shortness of breath, cardiac arrhythmias, dizziness, blood pressure fluctuations, myocardial infarction,  stroke/transient ischemic attack, nausea, vomiting, allergic reaction, radiation exposure, metallic taste sensation and life-threatening complications (estimated to be 1 in 10,000)], benefits (risk stratification, diagnosing coronary artery disease, treatment guidance) and alternatives of a cardiac PET stress test were discussed in detail with Mr. Daffin and he agrees to proceed.    2.  Hyperlipidemia - his LDL goal is less than 70.   - Check FLP and ALT - Continue Zetia  10 mg daily and Crestor  5 mg daily with as needed refills  3.  Hypertension   -  BP controlled on exam today - check BMET - Continue ramipril  5 mg daily with as needed refills   Medication Adjustments/Labs and Tests Ordered: Current medicines are reviewed at length with the patient today.  Concerns regarding medicines are outlined above.  Tests Ordered: Orders Placed This Encounter  Procedures   EKG 12-Lead    Medication Changes: No orders of the defined types were placed in this encounter.    Disposition:  Follow up in 1 year(s)  Signed, Wilbert Bihari, MD  07/24/2024 10:05 AM    [facility]

## 2024-07-24 NOTE — Patient Instructions (Signed)
 Medication Instructions:  Your physician recommends that you continue on your current medications as directed. Please refer to the Current Medication list given to you today.  *If you need a refill on your cardiac medications before your next appointment, please call your pharmacy*  Lab Work: Please complete your FASTING lipid panel and CMET today in our first floor lab before you leave.  If you have labs (blood work) drawn today and your tests are completely normal, you will receive your results only by: MyChart Message (if you have MyChart) OR A paper copy in the mail If you have any lab test that is abnormal or we need to change your treatment, we will call you to review the results.  Testing/Procedures:    Please report to Radiology at the [facility] Main Entrance 30 minutes early for your test.  [address] [place], [place] [address]                         How to Prepare for Your Cardiac PET/CT Stress Test:  Nothing to eat or drink, except water , 3 hours prior to arrival time.  NO caffeine/decaffeinated products, or chocolate 12 hours prior to arrival. (Please note decaffeinated beverages (teas/coffees) still contain caffeine).  If you have caffeine within 12 hours prior, the test will need to be rescheduled.  Medication instructions: Do not take erectile dysfunction medications for 72 hours prior to test (sildenafil, tadalafil) Do not take nitrates (isosorbide mononitrate, Ranexa) the day before or day of test Do not take tamsulosin the day before or morning of test Hold theophylline containing medications for 12 hours. Hold Dipyridamole 48 hours prior to the test.  You may take your remaining medications with water .  NO perfume, cologne or lotion on chest or abdomen area.  Total time is 1 to 2 hours; you may want to bring reading material for the waiting time.  IF YOU THINK YOU MAY BE PREGNANT, OR ARE NURSING PLEASE INFORM THE TECHNOLOGIST.  In  preparation for your appointment, medication and supplies will be purchased.  Appointment availability is limited, so if you need to cancel or reschedule, please call the Radiology Department Scheduler at [phone] 24 hours in advance to avoid a cancellation fee of $100.00  What to Expect When you Arrive:  Once you arrive and check in for your appointment, you will be taken to a preparation room within the Radiology Department.  A technologist or Nurse will obtain your medical history, verify that you are correctly prepped for the exam, and explain the procedure.  Afterwards, an IV will be started in your arm and electrodes will be placed on your skin for EKG monitoring during the stress portion of the exam. Then you will be escorted to the PET/CT scanner.  There, staff will get you positioned on the scanner and obtain a blood pressure and EKG.  During the exam, you will continue to be connected to the EKG and blood pressure machines.  A small, safe amount of a radioactive tracer will be injected in your IV to obtain a series of pictures of your heart along with an injection of a stress agent.    After your Exam:  It is recommended that you eat a meal and drink a caffeinated beverage to counter act any effects of the stress agent.  Drink plenty of fluids for the remainder of the day and urinate frequently for the first couple of hours after the exam.  Your doctor will inform you of your test results within 7-10 business days.  For more information and frequently asked questions, please visit our website: [email or web address]  For questions about your test or how to prepare for your test, please call: Cardiac Imaging Nurse Navigators Office: [phone]   Follow-Up: At [facility], you and your health needs are our priority.  As part of our continuing mission to provide you with exceptional heart care, our providers are all part of one team.  This team includes your  primary Cardiologist (physician) and Advanced Practice Providers or APPs (Physician Assistants and Nurse Practitioners) who all work together to provide you with the care you need, when you need it.  Your next appointment:   1 year(s)  Provider:   Wilbert Bihari, MD

## 2024-07-24 NOTE — Addendum Note (Signed)
 Addended by: JANIT GENI CROME on: 07/24/2024 10:25 AM   Modules accepted: Orders

## 2024-07-25 ENCOUNTER — Ambulatory Visit: Payer: Self-pay | Admitting: Cardiology

## 2024-07-25 LAB — LIPID PANEL
Chol/HDL Ratio: 2.9 ratio (ref 0.0–5.0)
Cholesterol, Total: 140 mg/dL (ref 100–199)
HDL: 49 mg/dL (ref >39)
LDL Chol Calc (NIH): 75 mg/dL (ref 0–99)
Triglycerides: 83 mg/dL (ref 0–149)
VLDL Cholesterol Cal: 16 mg/dL (ref 5–40)

## 2024-07-25 LAB — COMPREHENSIVE METABOLIC PANEL WITH GFR
ALT: 40 IU/L (ref 0–44)
AST: 33 IU/L (ref 0–40)
Albumin: 4.5 g/dL (ref 3.8–4.8)
Alkaline Phosphatase: 46 IU/L — ABNORMAL LOW (ref 47–123)
BUN/Creatinine Ratio: 14 (ref 10–24)
BUN: 16 mg/dL (ref 8–27)
Bilirubin Total: 0.6 mg/dL (ref 0.0–1.2)
CO2: 24 mmol/L (ref 20–29)
Calcium: 9.3 mg/dL (ref 8.6–10.2)
Chloride: 103 mmol/L (ref 96–106)
Creatinine, Ser: 1.14 mg/dL (ref 0.76–1.27)
Globulin, Total: 2.2 g/dL (ref 1.5–4.5)
Glucose: 79 mg/dL (ref 70–99)
Potassium: 4.9 mmol/L (ref 3.5–5.2)
Sodium: 140 mmol/L (ref 134–144)
Total Protein: 6.7 g/dL (ref 6.0–8.5)
eGFR: 69 mL/min/1.73 (ref >59)

## 2024-08-11 ENCOUNTER — Ambulatory Visit (INDEPENDENT_AMBULATORY_CARE_PROVIDER_SITE_OTHER): Admitting: Podiatry

## 2024-08-11 ENCOUNTER — Encounter: Payer: Self-pay | Admitting: Podiatry

## 2024-08-11 DIAGNOSIS — M7751 Other enthesopathy of right foot: Secondary | ICD-10-CM

## 2024-08-11 DIAGNOSIS — G629 Polyneuropathy, unspecified: Secondary | ICD-10-CM | POA: Diagnosis not present

## 2024-08-11 DIAGNOSIS — M7752 Other enthesopathy of left foot: Secondary | ICD-10-CM | POA: Diagnosis not present

## 2024-08-11 MED ORDER — GABAPENTIN 100 MG PO CAPS: 100.0000 mg | ORAL_CAPSULE | Freq: Three times a day (TID) | ORAL | 3 refills | Status: AC

## 2024-08-11 NOTE — Progress Notes (Signed)
 Subjective:   Patient ID: Marcus Greer, male   DOB: 73 y.o.   MRN: [id number]   HPI Patient presents stating that it seemed that the medication only worked for a few days he did get relief but unfortunately has come back and he felt like he also developed insomnia from the injections.  Patient states that he still is getting a lot of burning and tingling in his feet and cramping in the backs of his legs and is reducing caffeine and alcohol    ROS      Objective:  Physical Exam  Neurovascular status was found to be intact muscle strength adequate range of motion adequate with patient having multiple symptoms consistent with some form of subtle neuropathy and cramping which may be due to other factors.     Assessment:  Very difficult to make complete determination as to why he continues to experience these symptoms     Plan:  H&P reviewed discussed at great length at this point organ to start him on low-dose gabapentin  100 mg and then increase from there and patient was given all instructions for gabapentin  and what will be necessary.  We will try this 1 at night and then can gradually add from there see the results and decide what else may be of benefit to him

## 2024-08-18 ENCOUNTER — Encounter (HOSPITAL_COMMUNITY): Payer: Self-pay

## 2024-08-20 ENCOUNTER — Encounter (HOSPITAL_COMMUNITY): Admission: RE | Admit: 2024-08-20 | Discharge: 2024-08-20 | Attending: Cardiology | Admitting: Cardiology

## 2024-08-20 DIAGNOSIS — I2510 Atherosclerotic heart disease of native coronary artery without angina pectoris: Secondary | ICD-10-CM | POA: Diagnosis present

## 2024-08-20 LAB — NM PET CT CARDIAC PERFUSION MULTI W/ABSOLUTE BLOODFLOW
MBFR: 2.67
Nuc Rest EF: 65 %
Nuc Stress EF: 73 %
Rest MBF: 0.69 ml/g/min
Rest Nuclear Isotope Dose: 26.4 mCi
ST Depression (mm): 0 mm
Stress MBF: 1.84 ml/g/min
Stress Nuclear Isotope Dose: 26 mCi
TID: 0.81

## 2024-08-20 MED ORDER — RUBIDIUM RB82 GENERATOR (RUBYFILL)
26.0000 | PACK | Freq: Once | INTRAVENOUS | Status: AC
  Administered 2024-08-20: 26.3 via INTRAVENOUS

## 2024-08-20 MED ORDER — REGADENOSON 0.4 MG/5ML IV SOLN
INTRAVENOUS | Status: AC
  Filled 2024-08-20: qty 5

## 2024-08-20 MED ORDER — RUBIDIUM RB82 GENERATOR (RUBYFILL)
26.3700 | PACK | Freq: Once | INTRAVENOUS | Status: AC
  Administered 2024-08-20: 26.37 via INTRAVENOUS

## 2024-08-20 MED ORDER — REGADENOSON 0.4 MG/5ML IV SOLN
0.4000 mg | Freq: Once | INTRAVENOUS | Status: AC
  Administered 2024-08-20: 0.4 mg via INTRAVENOUS

## 2024-08-20 NOTE — Progress Notes (Signed)
 Pt. Tolerated lexci scan well.

## 2024-09-02 ENCOUNTER — Other Ambulatory Visit (HOSPITAL_BASED_OUTPATIENT_CLINIC_OR_DEPARTMENT_OTHER): Payer: Self-pay

## 2024-09-02 ENCOUNTER — Other Ambulatory Visit: Payer: Self-pay

## 2024-09-02 ENCOUNTER — Emergency Department (HOSPITAL_BASED_OUTPATIENT_CLINIC_OR_DEPARTMENT_OTHER)
Admission: EM | Admit: 2024-09-02 | Discharge: 2024-09-02 | Disposition: A | Discharge status: Home / Self Care | Source: Ambulatory Visit | Attending: Emergency Medicine | Admitting: Emergency Medicine

## 2024-09-02 ENCOUNTER — Encounter (HOSPITAL_BASED_OUTPATIENT_CLINIC_OR_DEPARTMENT_OTHER): Payer: Self-pay | Admitting: Emergency Medicine

## 2024-09-02 DIAGNOSIS — M79661 Pain in right lower leg: Secondary | ICD-10-CM | POA: Insufficient documentation

## 2024-09-02 DIAGNOSIS — Z7982 Long term (current) use of aspirin: Secondary | ICD-10-CM | POA: Diagnosis not present

## 2024-09-02 DIAGNOSIS — M79662 Pain in left lower leg: Secondary | ICD-10-CM | POA: Diagnosis not present

## 2024-09-02 DIAGNOSIS — R252 Cramp and spasm: Secondary | ICD-10-CM

## 2024-09-02 LAB — COMPREHENSIVE METABOLIC PANEL WITH GFR
ALT: 30 U/L (ref 0–44)
AST: 28 U/L (ref 15–41)
Albumin: 4.5 g/dL (ref 3.5–5.0)
Alkaline Phosphatase: 49 U/L (ref 38–126)
Anion gap: 13 (ref 5–15)
BUN: 18 mg/dL (ref 8–23)
CO2: 21 mmol/L — ABNORMAL LOW (ref 22–32)
Calcium: 9.8 mg/dL (ref 8.9–10.3)
Chloride: 102 mmol/L (ref 98–111)
Creatinine, Ser: 1.31 mg/dL — ABNORMAL HIGH (ref 0.61–1.24)
GFR, Estimated: 58 mL/min — ABNORMAL LOW
Glucose, Bld: 177 mg/dL — ABNORMAL HIGH (ref 70–99)
Potassium: 4 mmol/L (ref 3.5–5.1)
Sodium: 136 mmol/L (ref 135–145)
Total Bilirubin: 1 mg/dL (ref 0.0–1.2)
Total Protein: 7.5 g/dL (ref 6.5–8.1)

## 2024-09-02 LAB — CBC WITH DIFFERENTIAL/PLATELET
Abs Immature Granulocytes: 0.02 K/uL (ref 0.00–0.07)
Basophils Absolute: 0 K/uL (ref 0.0–0.1)
Basophils Relative: 0 %
Eosinophils Absolute: 0 K/uL (ref 0.0–0.5)
Eosinophils Relative: 0 %
HCT: 47.2 % (ref 39.0–52.0)
Hemoglobin: 16.4 g/dL (ref 13.0–17.0)
Immature Granulocytes: 0 %
Lymphocytes Relative: 14 %
Lymphs Abs: 1.4 K/uL (ref 0.7–4.0)
MCH: 30.9 pg (ref 26.0–34.0)
MCHC: 34.7 g/dL (ref 30.0–36.0)
MCV: 88.9 fL (ref 80.0–100.0)
Monocytes Absolute: 0.8 K/uL (ref 0.1–1.0)
Monocytes Relative: 8 %
Neutro Abs: 7.7 K/uL (ref 1.7–7.7)
Neutrophils Relative %: 78 %
Platelets: 213 K/uL (ref 150–400)
RBC: 5.31 MIL/uL (ref 4.22–5.81)
RDW: 12.1 % (ref 11.5–15.5)
WBC: 9.9 K/uL (ref 4.0–10.5)
nRBC: 0 % (ref 0.0–0.2)

## 2024-09-02 LAB — CK: Total CK: 203 U/L (ref 49–397)

## 2024-09-02 MED ORDER — METHOCARBAMOL 500 MG PO TABS
500.0000 mg | ORAL_TABLET | Freq: Two times a day (BID) | ORAL | 0 refills | Status: AC
  Filled 2024-09-02: qty 20, 10d supply, fill #0

## 2024-09-02 NOTE — ED Notes (Signed)
 DC paperwork given and verbally understood.

## 2024-09-02 NOTE — ED Triage Notes (Addendum)
 Pt endorses BLE pain for awhile, worsening over past 3 days. Also reports recent  dx with fatty liver. Adds that was referred by UC to r/o rhabdo

## 2024-09-02 NOTE — ED Provider Notes (Signed)
 " [place] EMERGENCY DEPARTMENT AT [facility] Provider Note   CSN: [id number] Arrival date & time: 09/02/24  1034     Patient presents with: Leg Pain   Marcus Greer is a 73 y.o. male.   25 male presents with worsening bilateral lower extremity pain for a while but has been worse the past 2 days.  Denies any history of trauma.  Does take a statin currently.  States that after activity he notices that his legs become tired and sore bilateral.  Denies any lower back pain.  No bowel or bladder dysfunction.  Does not use any assistive device to walk around.  Went to see his doctor today and sent here for further evaluation       Prior to Admission medications  Medication Sig Start Date End Date Taking? Authorizing Provider  aspirin  EC 81 MG tablet Take 81 mg by mouth daily.    [provider]  B Complex Vitamins (B COMPLEX PO) Take 1 tablet by mouth daily.    [provider]  Cholecalciferol (VITAMIN D3 PO) Take 1 capsule by mouth daily.    [provider]  ezetimibe  (ZETIA ) 10 MG tablet Take 1 tablet (10 mg total) by mouth daily. 07/24/24   Shlomo Wilbert SAUNDERS, MD  gabapentin  (NEURONTIN ) 100 MG capsule Take 1 capsule (100 mg total) by mouth 3 (three) times daily. 08/11/24   Magdalen Pasco RAMAN, DPM  ibuprofen (ADVIL) 200 MG tablet Take 200 mg by mouth at bedtime as needed for moderate pain (pain score 4-6).    [provider]  Magnesium Oxide 250 MG TABS Take 1 tablet by mouth daily.    [provider]  ramipril  (ALTACE ) 5 MG capsule Take 1 capsule (5 mg total) by mouth daily. 07/24/24   Shlomo Wilbert SAUNDERS, MD  rosuvastatin  (CRESTOR ) 5 MG tablet Take 1 tablet (5 mg total) by mouth daily. 07/24/24   Shlomo Wilbert SAUNDERS, MD  Turmeric 500 MG CAPS Take 500 mg by mouth daily.    [provider]    Allergies: Gemfibrozil, Iopamidol , Niacin er (antihyperlipidemic), Atorvastatin , Tape, and Vytorin  [ezetimibe -simvastatin ]    Review of Systems   All other systems reviewed and are negative.   Updated Vital Signs BP (!) 157/87 (BP Location: Left Arm)   Pulse 91   Temp 97.9 F (36.6 C) (Oral)   Resp 17   Wt 96.6 kg   SpO2 94%   BMI 32.39 kg/m   Physical Exam Vitals and nursing note reviewed.  Constitutional:      General: He is not in acute distress.    Appearance: Normal appearance. He is well-developed. He is not toxic-appearing.  HENT:     Head: Normocephalic and atraumatic.  Eyes:     General: Lids are normal.     Conjunctiva/sclera: Conjunctivae normal.     Pupils: Pupils are equal, round, and reactive to light.  Neck:     Thyroid: No thyroid mass.     Trachea: No tracheal deviation.  Cardiovascular:     Rate and Rhythm: Normal rate and regular rhythm.     Heart sounds: Normal heart sounds. No murmur heard.    No gallop.  Pulmonary:     Effort: Pulmonary effort is normal. No respiratory distress.     Breath sounds: Normal breath sounds. No stridor. No decreased breath sounds, wheezing, rhonchi or rales.  Abdominal:     General: There is no distension.     Palpations: Abdomen is soft.  Tenderness: There is no abdominal tenderness. There is no rebound.  Musculoskeletal:        General: No tenderness. Normal range of motion.     Cervical back: Normal range of motion and neck supple.     Comments: Neurovascular status intact bilateral lower extremities Bilateral thigh and calf compartments soft.  Skin:    General: Skin is warm and dry.     Findings: No abrasion or rash.  Neurological:     Mental Status: He is alert and oriented to person, place, and time. Mental status is at baseline.     GCS: GCS eye subscore is 4. GCS verbal subscore is 5. GCS motor subscore is 6.     Cranial Nerves: No cranial nerve deficit.     Sensory: No sensory deficit.     Motor: Motor function is intact.  Psychiatric:        Attention and Perception: Attention normal.        Speech: Speech normal.        Behavior: Behavior  normal.     (all labs ordered are listed, but only abnormal results are displayed) Labs Reviewed - No data to display  EKG: None  Radiology: No results found.   Procedures   Medications Ordered in the ED - No data to display                                  Medical Decision Making Amount and/or Complexity of Data Reviewed Labs: ordered.   Patient's labs here are reassuring.  CK is normal.  Was concerned about possible muscle injury due to taking a statin and his soreness.  Patient is have chronic kidney disease that is essentially unchanged.  Calcium  and potassium Show no abnormalities.  Will prescribe muscle relaxants and discharge    Final diagnoses:  None    ED Discharge Orders     None          Dasie Faden, MD 09/02/24 1308  "

## 2024-09-02 NOTE — ED Notes (Signed)
 ED Provider at bedside.

## 2024-10-06 ENCOUNTER — Ambulatory Visit: Admitting: Podiatry
